# Patient Record
Sex: Male | Born: 1985 | Race: Black or African American | Hispanic: No | Marital: Married | Smoking: Former smoker
Health system: Southern US, Community
[De-identification: ages and names within clinical notes are randomized; demographics above are authoritative.]

## PROBLEM LIST (undated history)

## (undated) DIAGNOSIS — F32A Depression, unspecified: Secondary | ICD-10-CM

## (undated) DIAGNOSIS — F419 Anxiety disorder, unspecified: Secondary | ICD-10-CM

## (undated) DIAGNOSIS — J45909 Unspecified asthma, uncomplicated: Secondary | ICD-10-CM

## (undated) DIAGNOSIS — F319 Bipolar disorder, unspecified: Secondary | ICD-10-CM

## (undated) DIAGNOSIS — F4310 Post-traumatic stress disorder, unspecified: Secondary | ICD-10-CM

## (undated) DIAGNOSIS — D649 Anemia, unspecified: Secondary | ICD-10-CM

## (undated) HISTORY — PX: COLON SURGERY: SHX602

## (undated) HISTORY — PX: CARDIAC SURGERY: SHX584

---
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
## 2015-08-08 ENCOUNTER — Emergency Department (HOSPITAL_COMMUNITY)
Admission: EM | Admit: 2015-08-08 | Discharge: 2015-08-08 | Disposition: A | Discharge status: Home / Self Care | Payer: Self-pay | Attending: Emergency Medicine | Admitting: Emergency Medicine

## 2015-08-08 ENCOUNTER — Encounter (HOSPITAL_COMMUNITY): Payer: Self-pay | Admitting: *Deleted

## 2015-08-08 ENCOUNTER — Emergency Department (HOSPITAL_COMMUNITY): Payer: Self-pay

## 2015-08-08 DIAGNOSIS — W010XXA Fall on same level from slipping, tripping and stumbling without subsequent striking against object, initial encounter: Secondary | ICD-10-CM | POA: Insufficient documentation

## 2015-08-08 DIAGNOSIS — J45909 Unspecified asthma, uncomplicated: Secondary | ICD-10-CM | POA: Insufficient documentation

## 2015-08-08 DIAGNOSIS — Y829 Unspecified medical devices associated with adverse incidents: Secondary | ICD-10-CM | POA: Insufficient documentation

## 2015-08-08 DIAGNOSIS — T8131XA Disruption of external operation (surgical) wound, not elsewhere classified, initial encounter: Secondary | ICD-10-CM | POA: Insufficient documentation

## 2015-08-08 DIAGNOSIS — Y9389 Activity, other specified: Secondary | ICD-10-CM | POA: Insufficient documentation

## 2015-08-08 DIAGNOSIS — S51812A Laceration without foreign body of left forearm, initial encounter: Secondary | ICD-10-CM | POA: Insufficient documentation

## 2015-08-08 DIAGNOSIS — Y92149 Unspecified place in prison as the place of occurrence of the external cause: Secondary | ICD-10-CM | POA: Insufficient documentation

## 2015-08-08 DIAGNOSIS — S41112A Laceration without foreign body of left upper arm, initial encounter: Secondary | ICD-10-CM

## 2015-08-08 DIAGNOSIS — Y998 Other external cause status: Secondary | ICD-10-CM | POA: Insufficient documentation

## 2015-08-08 DIAGNOSIS — Z23 Encounter for immunization: Secondary | ICD-10-CM | POA: Insufficient documentation

## 2015-08-08 DIAGNOSIS — Z862 Personal history of diseases of the blood and blood-forming organs and certain disorders involving the immune mechanism: Secondary | ICD-10-CM | POA: Insufficient documentation

## 2015-08-08 HISTORY — DX: Anemia, unspecified: D64.9

## 2015-08-08 HISTORY — DX: Unspecified asthma, uncomplicated: J45.909

## 2015-08-08 MED ORDER — ACETAMINOPHEN 500 MG PO TABS
1000.0000 mg | ORAL_TABLET | Freq: Once | ORAL | Status: AC
  Administered 2015-08-08: 1000 mg via ORAL
  Filled 2015-08-08: qty 2

## 2015-08-08 MED ORDER — CEPHALEXIN 500 MG PO CAPS: 500.0000 mg | ORAL_CAPSULE | Freq: Four times a day (QID) | ORAL | Status: DC

## 2015-08-08 MED ORDER — BUPIVACAINE-EPINEPHRINE (PF) 0.5% -1:200000 IJ SOLN
10.0000 mL | Freq: Once | INTRAMUSCULAR | Status: AC
  Administered 2015-08-08: 10 mL
  Filled 2015-08-08: qty 10

## 2015-08-08 MED ORDER — BACITRACIN ZINC 500 UNIT/GM EX OINT
TOPICAL_OINTMENT | Freq: Two times a day (BID) | CUTANEOUS | Status: DC
  Administered 2015-08-08: 1 via TOPICAL

## 2015-08-08 MED ORDER — TETANUS-DIPHTH-ACELL PERTUSSIS 5-2.5-18.5 LF-MCG/0.5 IM SUSP
0.5000 mL | Freq: Once | INTRAMUSCULAR | Status: AC
  Administered 2015-08-08: 0.5 mL via INTRAMUSCULAR
  Filled 2015-08-08: qty 0.5

## 2015-08-08 NOTE — Discharge Instructions (Signed)
You have been seen today for a laceration that reopened. Your imaging showed no abnormalities. Go to the clinic or return to the ED in 3 days for a wound recheck. Go to the clinic or return to the ED in 10 days for suture removal. Return sooner should signs of infection or dehiscence occur. Dehiscence is when the wound begins to come apart. Please take all of your antibiotics until finished!   You may develop abdominal discomfort or diarrhea from the antibiotic.  You may help offset this with probiotics which you can buy or get in yogurt. Do not eat or take the probiotics until 2 hours after your antibiotic.

## 2015-08-08 NOTE — ED Provider Notes (Signed)
With laceration at the volar aspect of distal forearm after he fell today laceration occurred at site of scar from laceration and identical place which occurred 5 months ago. On exam patient is alert no distress there is a linear laceration at the volar distal forearm with flexor tenderness exposed. Hand and wrist have full range of motion  Doug Sou, MD 08/08/15 2201
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
## 2018-06-13 HISTORY — PX: OTHER SURGICAL HISTORY: SHX169

## 2018-11-28 ENCOUNTER — Encounter (HOSPITAL_COMMUNITY): Payer: Self-pay | Admitting: Emergency Medicine

## 2018-11-28 ENCOUNTER — Other Ambulatory Visit: Payer: Self-pay

## 2018-11-28 ENCOUNTER — Emergency Department (HOSPITAL_COMMUNITY): Payer: Medicaid Other

## 2018-11-28 ENCOUNTER — Emergency Department (HOSPITAL_COMMUNITY)
Admission: EM | Admit: 2018-11-28 | Discharge: 2018-11-28 | Disposition: A | Discharge status: Home / Self Care | Payer: Medicaid Other | Attending: Emergency Medicine | Admitting: Emergency Medicine

## 2018-11-28 DIAGNOSIS — J45909 Unspecified asthma, uncomplicated: Secondary | ICD-10-CM | POA: Insufficient documentation

## 2018-11-28 DIAGNOSIS — R079 Chest pain, unspecified: Secondary | ICD-10-CM | POA: Diagnosis present

## 2018-11-28 DIAGNOSIS — R0789 Other chest pain: Secondary | ICD-10-CM | POA: Diagnosis not present

## 2018-11-28 LAB — COMPREHENSIVE METABOLIC PANEL
ALT: 33 U/L (ref 0–44)
AST: 21 U/L (ref 15–41)
Albumin: 3.2 g/dL — ABNORMAL LOW (ref 3.5–5.0)
Alkaline Phosphatase: 78 U/L (ref 38–126)
Anion gap: 9 (ref 5–15)
BUN: <5 mg/dL — ABNORMAL LOW (ref 6–20)
CO2: 24 mmol/L (ref 22–32)
Calcium: 9.3 mg/dL (ref 8.9–10.3)
Chloride: 106 mmol/L (ref 98–111)
Creatinine, Ser: 0.86 mg/dL (ref 0.61–1.24)
GFR calc Af Amer: >60 mL/min (ref >60)
GFR calc non Af Amer: >60 mL/min (ref >60)
Glucose, Bld: 85 mg/dL (ref 70–99)
Potassium: 3.9 mmol/L (ref 3.5–5.1)
Sodium: 139 mmol/L (ref 135–145)
Total Bilirubin: 0.4 mg/dL (ref 0.3–1.2)
Total Protein: 6.2 g/dL — ABNORMAL LOW (ref 6.5–8.1)

## 2018-11-28 LAB — CBC
HCT: 35.2 % — ABNORMAL LOW (ref 39.0–52.0)
Hemoglobin: 10.5 g/dL — ABNORMAL LOW (ref 13.0–17.0)
MCH: 21.3 pg — ABNORMAL LOW (ref 26.0–34.0)
MCHC: 29.8 g/dL — ABNORMAL LOW (ref 30.0–36.0)
MCV: 71.3 fL — ABNORMAL LOW (ref 80.0–100.0)
Platelets: 256 10*3/uL (ref 150–400)
RBC: 4.94 MIL/uL (ref 4.22–5.81)
RDW: 19.1 % — ABNORMAL HIGH (ref 11.5–15.5)
WBC: 6.3 10*3/uL (ref 4.0–10.5)
nRBC: 0 % (ref 0.0–0.2)

## 2018-11-28 LAB — TROPONIN I: Troponin I: <0.03 ng/mL (ref <0.03)

## 2018-11-28 MED ORDER — KETOROLAC TROMETHAMINE 30 MG/ML IJ SOLN
30.0000 mg | Freq: Once | INTRAMUSCULAR | Status: AC
Start: 2018-11-28 — End: 2018-11-28
  Administered 2018-11-28: 06:00:00 30 mg via INTRAVENOUS
  Filled 2018-11-28: qty 1

## 2018-11-28 MED ORDER — ALBUTEROL SULFATE HFA 108 (90 BASE) MCG/ACT IN AERS
2.0000 | INHALATION_SPRAY | Freq: Once | RESPIRATORY_TRACT | Status: AC
  Administered 2018-11-28: 2 via RESPIRATORY_TRACT
  Filled 2018-11-28: qty 6.7

## 2018-11-28 NOTE — ED Notes (Signed)
Patient transported to x-ray. ?
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
## 2018-11-28 NOTE — Discharge Instructions (Addendum)
You may alternate Tylenol 1000 mg every 6 hours as needed for pain and Ibuprofen 800 mg every 8 hours as needed for pain.  Please take Ibuprofen with food.  Take albuterol every 4 hrs for cough   Return to ER if you have worse cough, shortness of breath, fever
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
## 2019-04-20 ENCOUNTER — Encounter (HOSPITAL_BASED_OUTPATIENT_CLINIC_OR_DEPARTMENT_OTHER): Payer: Self-pay

## 2019-04-20 ENCOUNTER — Other Ambulatory Visit: Payer: Self-pay

## 2019-04-20 ENCOUNTER — Emergency Department (HOSPITAL_BASED_OUTPATIENT_CLINIC_OR_DEPARTMENT_OTHER): Payer: Medicaid Other

## 2019-04-20 ENCOUNTER — Emergency Department (HOSPITAL_BASED_OUTPATIENT_CLINIC_OR_DEPARTMENT_OTHER)
Admission: EM | Admit: 2019-04-20 | Discharge: 2019-04-20 | Disposition: A | Discharge status: Home / Self Care | Payer: Medicaid Other | Attending: Emergency Medicine | Admitting: Emergency Medicine

## 2019-04-20 DIAGNOSIS — Z91012 Allergy to eggs: Secondary | ICD-10-CM | POA: Insufficient documentation

## 2019-04-20 DIAGNOSIS — Z20828 Contact with and (suspected) exposure to other viral communicable diseases: Secondary | ICD-10-CM | POA: Insufficient documentation

## 2019-04-20 DIAGNOSIS — Z91013 Allergy to seafood: Secondary | ICD-10-CM | POA: Insufficient documentation

## 2019-04-20 DIAGNOSIS — Z91010 Allergy to peanuts: Secondary | ICD-10-CM | POA: Insufficient documentation

## 2019-04-20 DIAGNOSIS — J069 Acute upper respiratory infection, unspecified: Secondary | ICD-10-CM | POA: Diagnosis not present

## 2019-04-20 DIAGNOSIS — F17290 Nicotine dependence, other tobacco product, uncomplicated: Secondary | ICD-10-CM | POA: Insufficient documentation

## 2019-04-20 DIAGNOSIS — R05 Cough: Secondary | ICD-10-CM | POA: Diagnosis present

## 2019-04-20 MED ORDER — BENZONATATE 100 MG PO CAPS: 100.0000 mg | ORAL_CAPSULE | Freq: Three times a day (TID) | ORAL | 0 refills | Status: DC

## 2019-04-20 MED ORDER — DEXAMETHASONE 6 MG PO TABS
12.0000 mg | ORAL_TABLET | Freq: Once | ORAL | Status: AC
  Administered 2019-04-20: 12:00:00 12 mg via ORAL

## 2019-04-20 MED ORDER — DEXAMETHASONE 4 MG PO TABS
ORAL_TABLET | ORAL | Status: AC
  Administered 2019-04-20: 12 mg via ORAL
  Filled 2019-04-20: qty 3

## 2019-04-20 NOTE — ED Triage Notes (Signed)
Pt states cough congestion for 2 days, states possible exposure to covid.  Vomited yesterday.

## 2019-04-21 LAB — SARS CORONAVIRUS 2 (TAT 6-24 HRS): SARS Coronavirus 2: NEGATIVE
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
## 2019-10-07 ENCOUNTER — Emergency Department (HOSPITAL_COMMUNITY)
Admission: EM | Admit: 2019-10-07 | Discharge: 2019-10-07 | Discharge status: Against Medical Advice | Payer: Medicaid Other | Attending: Emergency Medicine | Admitting: Emergency Medicine

## 2019-10-07 ENCOUNTER — Other Ambulatory Visit: Payer: Self-pay

## 2019-10-07 DIAGNOSIS — Z5321 Procedure and treatment not carried out due to patient leaving prior to being seen by health care provider: Secondary | ICD-10-CM | POA: Insufficient documentation

## 2019-10-07 DIAGNOSIS — R1110 Vomiting, unspecified: Secondary | ICD-10-CM | POA: Insufficient documentation

## 2019-10-07 NOTE — ED Triage Notes (Signed)
Pt c/o N/V/D since yesterday. Denies being around anyone sick.

## 2019-10-07 NOTE — ED Notes (Signed)
Pt decided to leave due to wait. States that he just wants something for the nausea and his blood work and to go home.Advised pt that this is not how that works. Pt states he will just leave.

## 2020-03-07 ENCOUNTER — Encounter (HOSPITAL_COMMUNITY): Payer: Self-pay | Admitting: Emergency Medicine

## 2020-03-07 ENCOUNTER — Emergency Department (HOSPITAL_COMMUNITY)
Admission: EM | Admit: 2020-03-07 | Discharge: 2020-03-07 | Disposition: A | Discharge status: Home / Self Care | Payer: Medicaid Other | Attending: Emergency Medicine | Admitting: Emergency Medicine

## 2020-03-07 DIAGNOSIS — Z91010 Allergy to peanuts: Secondary | ICD-10-CM | POA: Insufficient documentation

## 2020-03-07 DIAGNOSIS — J45909 Unspecified asthma, uncomplicated: Secondary | ICD-10-CM | POA: Insufficient documentation

## 2020-03-07 DIAGNOSIS — S61411A Laceration without foreign body of right hand, initial encounter: Secondary | ICD-10-CM

## 2020-03-07 DIAGNOSIS — S6991XA Unspecified injury of right wrist, hand and finger(s), initial encounter: Secondary | ICD-10-CM | POA: Diagnosis present

## 2020-03-07 DIAGNOSIS — W260XXA Contact with knife, initial encounter: Secondary | ICD-10-CM | POA: Insufficient documentation

## 2020-03-07 DIAGNOSIS — Y93G1 Activity, food preparation and clean up: Secondary | ICD-10-CM | POA: Diagnosis not present

## 2020-03-07 DIAGNOSIS — F17290 Nicotine dependence, other tobacco product, uncomplicated: Secondary | ICD-10-CM | POA: Insufficient documentation

## 2020-03-07 MED ORDER — CEPHALEXIN 500 MG PO CAPS: 500.0000 mg | ORAL_CAPSULE | Freq: Four times a day (QID) | ORAL | 0 refills | Status: AC

## 2020-03-07 NOTE — ED Notes (Signed)
Patient verbalizes understanding of discharge instructions. Opportunity for questioning and answers were provided. Pt discharged from ED. 
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
## 2020-03-07 NOTE — Discharge Instructions (Signed)
Take antibiotics as prescribed.  Take the entire course, even if symptoms improve. Soak your hand in soapy water for 20 minutes at a time 3 times a day for the next week. Try and keep your hand covered, do not pick at it or spread the cut open, as this will slow healing. Follow-up with a hand doctor listed below if your symptoms not improving after being on antibiotics for more than 48 hours. Return to the emergency room if you develop high fevers, redness streaking up past the wrist, numbness of your fingers, or any new or worsening, concerning symptoms.
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
## 2020-04-15 ENCOUNTER — Other Ambulatory Visit: Payer: Self-pay

## 2020-04-15 ENCOUNTER — Emergency Department (HOSPITAL_COMMUNITY): Payer: Medicaid Other

## 2020-04-15 ENCOUNTER — Emergency Department (HOSPITAL_COMMUNITY)
Admission: EM | Admit: 2020-04-15 | Discharge: 2020-04-15 | Disposition: A | Discharge status: Home / Self Care | Payer: Medicaid Other | Attending: Emergency Medicine | Admitting: Emergency Medicine

## 2020-04-15 ENCOUNTER — Encounter (HOSPITAL_COMMUNITY): Payer: Self-pay | Admitting: Emergency Medicine

## 2020-04-15 DIAGNOSIS — J45909 Unspecified asthma, uncomplicated: Secondary | ICD-10-CM | POA: Insufficient documentation

## 2020-04-15 DIAGNOSIS — S61411A Laceration without foreign body of right hand, initial encounter: Secondary | ICD-10-CM | POA: Diagnosis present

## 2020-04-15 DIAGNOSIS — Y30XXXA Falling, jumping or pushed from a high place, undetermined intent, initial encounter: Secondary | ICD-10-CM | POA: Diagnosis not present

## 2020-04-15 DIAGNOSIS — Y9289 Other specified places as the place of occurrence of the external cause: Secondary | ICD-10-CM | POA: Diagnosis not present

## 2020-04-15 DIAGNOSIS — F17290 Nicotine dependence, other tobacco product, uncomplicated: Secondary | ICD-10-CM | POA: Insufficient documentation

## 2020-04-15 DIAGNOSIS — Z23 Encounter for immunization: Secondary | ICD-10-CM | POA: Insufficient documentation

## 2020-04-15 DIAGNOSIS — S61412A Laceration without foreign body of left hand, initial encounter: Secondary | ICD-10-CM | POA: Insufficient documentation

## 2020-04-15 DIAGNOSIS — Y9339 Activity, other involving climbing, rappelling and jumping off: Secondary | ICD-10-CM | POA: Insufficient documentation

## 2020-04-15 DIAGNOSIS — Z91010 Allergy to peanuts: Secondary | ICD-10-CM | POA: Insufficient documentation

## 2020-04-15 MED ORDER — CEPHALEXIN 250 MG PO CAPS
500.0000 mg | ORAL_CAPSULE | Freq: Once | ORAL | Status: AC
  Administered 2020-04-15: 500 mg via ORAL
  Filled 2020-04-15: qty 2

## 2020-04-15 MED ORDER — TETANUS-DIPHTH-ACELL PERTUSSIS 5-2.5-18.5 LF-MCG/0.5 IM SUSY
0.5000 mL | PREFILLED_SYRINGE | Freq: Once | INTRAMUSCULAR | Status: AC
  Administered 2020-04-15: 0.5 mL via INTRAMUSCULAR
  Filled 2020-04-15: qty 0.5

## 2020-04-15 MED ORDER — CEPHALEXIN 500 MG PO CAPS: 500.0000 mg | ORAL_CAPSULE | Freq: Three times a day (TID) | ORAL | 0 refills | Status: DC

## 2020-04-15 MED ORDER — OXYCODONE HCL 5 MG PO TABS
5.0000 mg | ORAL_TABLET | Freq: Four times a day (QID) | ORAL | 0 refills | Status: DC | PRN
Start: 2020-04-15 — End: 2021-03-17

## 2020-04-15 MED ORDER — LIDOCAINE-EPINEPHRINE 1 %-1:100000 IJ SOLN
10.0000 mL | Freq: Once | INTRAMUSCULAR | Status: DC
  Filled 2020-04-15: qty 1

## 2020-04-15 NOTE — Discharge Instructions (Signed)
Please read and follow all provided instructions.  Your diagnoses today include:  1. Laceration of left hand without foreign body, initial encounter     Tests performed today include:  X-ray of the affected area that did not show any foreign bodies or broken bones  Vital signs. See below for your results today.   Medications prescribed:   Keflex (cephalexin) - antibiotic  You have been prescribed an antibiotic medicine: take the entire course of medicine even if you are feeling better. Stopping early can cause the antibiotic not to work.   Oxycodone - narcotic pain medication  DO NOT drive or perform any activities that require you to be awake and alert because this medicine can make you drowsy.   Take any prescribed medications only as directed.   Home care instructions:  Follow any educational materials and wound care instructions contained in this packet.   Keep affected area above the level of your heart when possible to minimize swelling. Wash area gently twice a day with warm soapy water. Do not apply alcohol or hydrogen peroxide. Cover the area if it draining or weeping.   Follow-up instructions: Suture Removal: Go to the [facility], return to the Emergency Department or see your primary care care doctor in 14 days for a recheck of your wound and removal of your sutures or staples.    Return instructions:  Return to the Emergency Department if you have:  Fever  Worsening pain  Worsening swelling of the wound  Pus draining from the wound  Redness of the skin that moves away from the wound, especially if it streaks away from the affected area   Any other emergent concerns  Your vital signs today were: BP (!) 145/89 (BP Location: Left Arm)   Pulse 77   Temp 98 F (36.7 C) (Oral)   Resp 16   SpO2 100%  If your blood pressure (BP) was elevated above 135/85 this visit, please have this repeated by your doctor within one month. --------------

## 2020-04-15 NOTE — ED Triage Notes (Signed)
Patient was climbing a fence to get home this morning, he has sliced open his right palm below his pinky finger.  It is an L shaped cut, full thickness, ligaments and muscle able to be seen.
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
## 2020-08-21 ENCOUNTER — Other Ambulatory Visit: Payer: Self-pay

## 2020-08-21 ENCOUNTER — Emergency Department (HOSPITAL_COMMUNITY)
Admission: EM | Admit: 2020-08-21 | Discharge: 2020-08-21 | Disposition: A | Discharge status: Home / Self Care | Attending: Emergency Medicine | Admitting: Emergency Medicine

## 2020-08-21 ENCOUNTER — Encounter (HOSPITAL_COMMUNITY): Payer: Self-pay

## 2020-08-21 DIAGNOSIS — F17290 Nicotine dependence, other tobacco product, uncomplicated: Secondary | ICD-10-CM | POA: Diagnosis not present

## 2020-08-21 DIAGNOSIS — S61512A Laceration without foreign body of left wrist, initial encounter: Secondary | ICD-10-CM | POA: Insufficient documentation

## 2020-08-21 DIAGNOSIS — J45909 Unspecified asthma, uncomplicated: Secondary | ICD-10-CM | POA: Insufficient documentation

## 2020-08-21 DIAGNOSIS — T1491XA Suicide attempt, initial encounter: Secondary | ICD-10-CM

## 2020-08-21 DIAGNOSIS — R45851 Suicidal ideations: Secondary | ICD-10-CM | POA: Diagnosis not present

## 2020-08-21 DIAGNOSIS — Z91010 Allergy to peanuts: Secondary | ICD-10-CM | POA: Diagnosis not present

## 2020-08-21 DIAGNOSIS — X831XXA Intentional self-harm by electrocution, initial encounter: Secondary | ICD-10-CM | POA: Diagnosis not present

## 2020-08-21 DIAGNOSIS — S6992XA Unspecified injury of left wrist, hand and finger(s), initial encounter: Secondary | ICD-10-CM | POA: Diagnosis present
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
## 2020-08-21 NOTE — Discharge Instructions (Addendum)
Recommend suicide watch and psychiatric assessment/care.   He refuses any further medical treatment in the emergency department. Wound care instructions provided.

## 2020-08-21 NOTE — ED Notes (Signed)
Pt discharged from this facility at this time. Discharged in policy custody. All instructions reviewed with no further questions at this time.
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
## 2020-09-04 ENCOUNTER — Encounter (HOSPITAL_COMMUNITY): Payer: Self-pay

## 2020-09-04 ENCOUNTER — Emergency Department (HOSPITAL_COMMUNITY)
Admission: EM | Admit: 2020-09-04 | Discharge: 2020-09-04 | Disposition: A | Discharge status: Home / Self Care | Attending: Emergency Medicine | Admitting: Emergency Medicine

## 2020-09-04 DIAGNOSIS — Z23 Encounter for immunization: Secondary | ICD-10-CM | POA: Insufficient documentation

## 2020-09-04 DIAGNOSIS — X58XXXA Exposure to other specified factors, initial encounter: Secondary | ICD-10-CM | POA: Insufficient documentation

## 2020-09-04 DIAGNOSIS — S6992XA Unspecified injury of left wrist, hand and finger(s), initial encounter: Secondary | ICD-10-CM | POA: Diagnosis present

## 2020-09-04 DIAGNOSIS — F17200 Nicotine dependence, unspecified, uncomplicated: Secondary | ICD-10-CM | POA: Diagnosis not present

## 2020-09-04 DIAGNOSIS — J45909 Unspecified asthma, uncomplicated: Secondary | ICD-10-CM | POA: Insufficient documentation

## 2020-09-04 DIAGNOSIS — Z91010 Allergy to peanuts: Secondary | ICD-10-CM | POA: Diagnosis not present

## 2020-09-04 DIAGNOSIS — F4325 Adjustment disorder with mixed disturbance of emotions and conduct: Secondary | ICD-10-CM | POA: Insufficient documentation

## 2020-09-04 DIAGNOSIS — R45851 Suicidal ideations: Secondary | ICD-10-CM | POA: Insufficient documentation

## 2020-09-04 DIAGNOSIS — S61512A Laceration without foreign body of left wrist, initial encounter: Secondary | ICD-10-CM | POA: Insufficient documentation

## 2020-09-04 LAB — COMPREHENSIVE METABOLIC PANEL
ALT: 23 U/L (ref 0–44)
AST: 31 U/L (ref 15–41)
Albumin: 4.3 g/dL (ref 3.5–5.0)
Alkaline Phosphatase: 64 U/L (ref 38–126)
Anion gap: 9 (ref 5–15)
BUN: 12 mg/dL (ref 6–20)
CO2: 24 mmol/L (ref 22–32)
Calcium: 9.4 mg/dL (ref 8.9–10.3)
Chloride: 105 mmol/L (ref 98–111)
Creatinine, Ser: 1.03 mg/dL (ref 0.61–1.24)
GFR, Estimated: >60 mL/min (ref >60)
Glucose, Bld: 96 mg/dL (ref 70–99)
Potassium: 3.8 mmol/L (ref 3.5–5.1)
Sodium: 138 mmol/L (ref 135–145)
Total Bilirubin: 1.8 mg/dL — ABNORMAL HIGH (ref 0.3–1.2)
Total Protein: 7.6 g/dL (ref 6.5–8.1)

## 2020-09-04 LAB — CBC WITH DIFFERENTIAL/PLATELET
Abs Immature Granulocytes: 0.06 10*3/uL (ref 0.00–0.07)
Basophils Absolute: 0 10*3/uL (ref 0.0–0.1)
Basophils Relative: 0 %
Eosinophils Absolute: 0 10*3/uL (ref 0.0–0.5)
Eosinophils Relative: 0 %
HCT: 47.7 % (ref 39.0–52.0)
Hemoglobin: 15.2 g/dL (ref 13.0–17.0)
Immature Granulocytes: 1 %
Lymphocytes Relative: 11 %
Lymphs Abs: 1.2 10*3/uL (ref 0.7–4.0)
MCH: 24.2 pg — ABNORMAL LOW (ref 26.0–34.0)
MCHC: 31.9 g/dL (ref 30.0–36.0)
MCV: 76 fL — ABNORMAL LOW (ref 80.0–100.0)
Monocytes Absolute: 1 10*3/uL (ref 0.1–1.0)
Monocytes Relative: 10 %
Neutro Abs: 7.9 10*3/uL — ABNORMAL HIGH (ref 1.7–7.7)
Neutrophils Relative %: 78 %
Platelets: 272 10*3/uL (ref 150–400)
RBC: 6.28 MIL/uL — ABNORMAL HIGH (ref 4.22–5.81)
RDW: 16.6 % — ABNORMAL HIGH (ref 11.5–15.5)
WBC: 10.1 10*3/uL (ref 4.0–10.5)
nRBC: 0 % (ref 0.0–0.2)

## 2020-09-04 LAB — ETHANOL: Alcohol, Ethyl (B): <10 mg/dL (ref <10)

## 2020-09-04 MED ORDER — TETANUS-DIPHTH-ACELL PERTUSSIS 5-2.5-18.5 LF-MCG/0.5 IM SUSY
0.5000 mL | PREFILLED_SYRINGE | Freq: Once | INTRAMUSCULAR | Status: AC
  Administered 2020-09-04: 0.5 mL via INTRAMUSCULAR
  Filled 2020-09-04: qty 0.5

## 2020-09-04 NOTE — ED Triage Notes (Signed)
Pt [facility] the ED from jail via ems for a left forearm laceration. Pt has a hx of self-harm and extensive psychiatric hx. Per EMS, pt lacerated left wrist with plastic spoon.

## 2020-09-04 NOTE — ED Notes (Signed)
TTS at the bedside. 

## 2020-09-04 NOTE — ED Notes (Addendum)
Patient had removed previous bandage from wound. Refusing to let staff re-wrap his arm. Explained that it needs to be wrapped to keep it clean, patient still refusing. Alert and oriented x4. Discharged back to jail, with [facility].
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
## 2020-09-04 NOTE — BH Assessment (Signed)
[facility] Assessment Progress Note  Per Nanine Means, NP, this voluntary pt does not require psychiatric hospitalization at this time.  Pt is psychiatrically cleared.  Discharge instructions include referral information for [facility] where pt has received services in the past, as well as [facility].  EDP Bethann Berkshire, MD and pt's nurses, Ladona Ridgel and [place], have been notified.  Doylene Canning, MA Triage Specialist [phone]
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
## 2020-09-04 NOTE — ED Notes (Signed)
Per ED tech, pt removed bandage from left forearm lac. ED tech to redress pt's wound.

## 2020-09-04 NOTE — ED Notes (Signed)
Pt bandage on left forearm redressed after pt took off initial bandage. Pt educated on chance of infection should bandage remain off. Provider notified

## 2020-09-04 NOTE — Discharge Instructions (Addendum)
For your mental health needs, you are advised to continue treatment with [facility]:       [facility]      [address], [address]      [place], [place] [address]      [phone]  If for any reason you are unable to follow up with [facility], contact [facility]:       [facility]      [address]      [place], [place] [address]      [phone]  Patient must remain on suicide watch at the jail.  He needs to keep his laceration covered and clean it twice a day with soap and water.  We are going to refer him to a specialist to get that rechecked next week

## 2020-09-04 NOTE — ED Notes (Signed)
Pt's left forearm lac cleaned and dressed by ED tech.

## 2020-09-08 ENCOUNTER — Other Ambulatory Visit: Payer: Self-pay

## 2020-09-08 ENCOUNTER — Emergency Department (HOSPITAL_COMMUNITY)
Admission: EM | Admit: 2020-09-08 | Discharge: 2020-09-08 | Disposition: A | Discharge status: Home / Self Care | Attending: Emergency Medicine | Admitting: Emergency Medicine

## 2020-09-08 ENCOUNTER — Emergency Department (HOSPITAL_COMMUNITY)

## 2020-09-08 ENCOUNTER — Encounter (HOSPITAL_COMMUNITY): Payer: Self-pay

## 2020-09-08 DIAGNOSIS — F17290 Nicotine dependence, other tobacco product, uncomplicated: Secondary | ICD-10-CM | POA: Insufficient documentation

## 2020-09-08 DIAGNOSIS — Y92149 Unspecified place in prison as the place of occurrence of the external cause: Secondary | ICD-10-CM | POA: Diagnosis not present

## 2020-09-08 DIAGNOSIS — M795 Residual foreign body in soft tissue: Secondary | ICD-10-CM

## 2020-09-08 DIAGNOSIS — J45909 Unspecified asthma, uncomplicated: Secondary | ICD-10-CM | POA: Diagnosis not present

## 2020-09-08 DIAGNOSIS — X788XXA Intentional self-harm by other sharp object, initial encounter: Secondary | ICD-10-CM | POA: Diagnosis not present

## 2020-09-08 DIAGNOSIS — S50852A Superficial foreign body of left forearm, initial encounter: Secondary | ICD-10-CM | POA: Diagnosis not present

## 2020-09-08 DIAGNOSIS — Z91010 Allergy to peanuts: Secondary | ICD-10-CM | POA: Diagnosis not present

## 2020-09-08 DIAGNOSIS — S59912A Unspecified injury of left forearm, initial encounter: Secondary | ICD-10-CM | POA: Diagnosis present

## 2020-09-08 MED ORDER — CEPHALEXIN 500 MG PO CAPS: 500.0000 mg | ORAL_CAPSULE | Freq: Four times a day (QID) | ORAL | 0 refills | Status: DC

## 2020-09-08 MED ORDER — LIDOCAINE HCL (PF) 1 % IJ SOLN
30.0000 mL | Freq: Once | INTRAMUSCULAR | Status: AC
  Administered 2020-09-08: 30 mL
  Filled 2020-09-08: qty 30

## 2020-09-08 MED ORDER — BACITRACIN ZINC 500 UNIT/GM EX OINT
TOPICAL_OINTMENT | Freq: Two times a day (BID) | CUTANEOUS | Status: DC
  Filled 2020-09-08: qty 0.9

## 2020-09-08 NOTE — ED Triage Notes (Signed)
Pt to ED from jail for evaluation of left arm. Pt states he cut his arm and stuck a paperclip up into the incision. Pt reports it as  "self injurious" . Arrives A+O, VSS, NADN.
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
## 2020-09-08 NOTE — ED Notes (Signed)
Left forearm wound cleaned and dressed. Pt educated on how to perform wound care.

## 2020-09-08 NOTE — Discharge Instructions (Addendum)
You will need to follow-up with wound care at the person.  You will need to maintain the splint clean and dry along with apply bacitracin ointment on it.  I have prescribed the antibiotics to help treat infection, please take 1 tablet 4 times a day for the next 7 days.  The number to Dr. Carollee Massed office is attached to your chart if your wound does not heal or you need further evaluation you will need to schedule an appointment with him.

## 2020-09-11 ENCOUNTER — Other Ambulatory Visit: Payer: Self-pay

## 2020-09-11 ENCOUNTER — Emergency Department (HOSPITAL_COMMUNITY)

## 2020-09-11 ENCOUNTER — Encounter (HOSPITAL_COMMUNITY): Payer: Self-pay | Admitting: Emergency Medicine

## 2020-09-11 ENCOUNTER — Emergency Department (HOSPITAL_COMMUNITY)
Admission: EM | Admit: 2020-09-11 | Discharge: 2020-09-12 | Disposition: A | Discharge status: Home / Self Care | Attending: Emergency Medicine | Admitting: Emergency Medicine

## 2020-09-11 DIAGNOSIS — S41112A Laceration without foreign body of left upper arm, initial encounter: Secondary | ICD-10-CM

## 2020-09-11 DIAGNOSIS — S51822A Laceration with foreign body of left forearm, initial encounter: Secondary | ICD-10-CM | POA: Diagnosis not present

## 2020-09-11 DIAGNOSIS — F17290 Nicotine dependence, other tobacco product, uncomplicated: Secondary | ICD-10-CM | POA: Diagnosis not present

## 2020-09-11 DIAGNOSIS — Z91010 Allergy to peanuts: Secondary | ICD-10-CM | POA: Diagnosis not present

## 2020-09-11 DIAGNOSIS — J45909 Unspecified asthma, uncomplicated: Secondary | ICD-10-CM | POA: Diagnosis not present

## 2020-09-11 DIAGNOSIS — X789XXA Intentional self-harm by unspecified sharp object, initial encounter: Secondary | ICD-10-CM | POA: Insufficient documentation

## 2020-09-11 DIAGNOSIS — Y92149 Unspecified place in prison as the place of occurrence of the external cause: Secondary | ICD-10-CM | POA: Insufficient documentation

## 2020-09-11 DIAGNOSIS — S59912A Unspecified injury of left forearm, initial encounter: Secondary | ICD-10-CM | POA: Diagnosis present

## 2020-09-11 DIAGNOSIS — S40852A Superficial foreign body of left upper arm, initial encounter: Secondary | ICD-10-CM

## 2020-09-11 MED ORDER — CEPHALEXIN 500 MG PO CAPS: 500.0000 mg | ORAL_CAPSULE | Freq: Four times a day (QID) | ORAL | 0 refills | Status: AC

## 2020-09-11 MED ORDER — BUPIVACAINE HCL (PF) 0.5 % IJ SOLN
20.0000 mL | Freq: Once | INTRAMUSCULAR | Status: AC
  Administered 2020-09-11: 20 mL
  Filled 2020-09-11 (×2): qty 30

## 2020-09-11 MED ORDER — LIDOCAINE HCL (PF) 1 % IJ SOLN
10.0000 mL | Freq: Once | INTRAMUSCULAR | Status: AC
  Administered 2020-09-11: 10 mL
  Filled 2020-09-11: qty 30

## 2020-09-11 NOTE — ED Triage Notes (Signed)
Pt arrives from jail with self inflicted wound. Pt cut L arm with paperclip. Pieces of paperclip are still in arm. Pt reports he does feel like he wants to kill himself.

## 2020-09-11 NOTE — ED Notes (Signed)
Cleansed/irrigated wounds with ns.  Pt tolerated well.  Foreign body noted in left lower forearm arm wound (pt states paper clips were used to harm self).

## 2020-09-11 NOTE — Discharge Instructions (Addendum)
Elevate the left arm above your heart is much as possible.  Take Tylenol for pain.  Call the hand surgeon for a follow-up appointment on Monday.  He will evaluate the foreign body that remains in your left forearm and determine the best course of action to treat that.  You can leave the bandage on the left arm, until you see the surgeon and unless it is causing a lot of pain, bleeding or swelling.  Make sure it gets checked out if you have these problems.
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
## 2020-09-11 NOTE — ED Provider Notes (Signed)
  Face-to-face evaluation   History:   Physical exam:   .Foreign Body Removal  Date/Time: 09/11/2020 11:05 PM Performed by: Mancel Bale, MD Authorized by: Mancel Bale, MD  Consent: Verbal consent obtained. Patient identity confirmed: verbally with patient Intake: Left volar forearm. Anesthesia: local infiltration  Anesthesia: Local Anesthetic: bupivacaine 0.5% without epinephrine Anesthetic total: 15 mL  Sedation: Patient sedated: no  Patient restrained: no Complexity: complex 1 objects recovered. Objects recovered: A piece of paper clip Post-procedure assessment: foreign body not removed Patient tolerance: patient tolerated the procedure well with no immediate complications Comments: Patient had 2 foreign bodies in the wound of his left volar forearm.  The more distal foreign body, seen on second radiograph, was removed after untwisted and removed from the tendon material.  The other more proximal foreign body, essentially linear, could not be palpated, after probing about 3 cm more proximal in the extent of the wound.  The foreign body is likely deep in the muscle bundle, beyond what I can explore in the emergency department setting.  The wound was closed with suture material, loosely, to improve healing, and comfort.  3 loose simple sutures, were placed using 3-0 Prolene.  Xeroform dressing placed.    Medical screening examination/treatment/procedure(s) were conducted as a shared visit with non-physician practitioner(s) and myself.  I personally evaluated the patient during the encounter    Mancel Bale, MD 09/14/20 1005

## 2020-09-12 NOTE — ED Notes (Signed)
Cleaned lower forearm wound with NS, applied xeroform gauze, covered with sterile 4x4 dry gauze, wrapped with kerlix then secured with coban.

## 2020-09-13 ENCOUNTER — Encounter (HOSPITAL_COMMUNITY): Payer: Self-pay | Admitting: *Deleted

## 2020-09-13 ENCOUNTER — Emergency Department (HOSPITAL_COMMUNITY)
Admission: EM | Admit: 2020-09-13 | Discharge: 2020-09-13 | Disposition: A | Discharge status: Home / Self Care | Attending: Emergency Medicine | Admitting: Emergency Medicine

## 2020-09-13 ENCOUNTER — Other Ambulatory Visit: Payer: Self-pay

## 2020-09-13 ENCOUNTER — Encounter (HOSPITAL_COMMUNITY): Payer: Self-pay | Admitting: Emergency Medicine

## 2020-09-13 DIAGNOSIS — S31119A Laceration without foreign body of abdominal wall, unspecified quadrant without penetration into peritoneal cavity, initial encounter: Secondary | ICD-10-CM

## 2020-09-13 DIAGNOSIS — S41112A Laceration without foreign body of left upper arm, initial encounter: Secondary | ICD-10-CM | POA: Diagnosis not present

## 2020-09-13 DIAGNOSIS — S3991XA Unspecified injury of abdomen, initial encounter: Secondary | ICD-10-CM | POA: Diagnosis present

## 2020-09-13 DIAGNOSIS — W268XXA Contact with other sharp object(s), not elsewhere classified, initial encounter: Secondary | ICD-10-CM | POA: Diagnosis not present

## 2020-09-13 DIAGNOSIS — F17200 Nicotine dependence, unspecified, uncomplicated: Secondary | ICD-10-CM | POA: Diagnosis not present

## 2020-09-13 DIAGNOSIS — S4992XA Unspecified injury of left shoulder and upper arm, initial encounter: Secondary | ICD-10-CM | POA: Diagnosis present

## 2020-09-13 DIAGNOSIS — F17290 Nicotine dependence, other tobacco product, uncomplicated: Secondary | ICD-10-CM | POA: Insufficient documentation

## 2020-09-13 DIAGNOSIS — X788XXA Intentional self-harm by other sharp object, initial encounter: Secondary | ICD-10-CM | POA: Diagnosis not present

## 2020-09-13 DIAGNOSIS — J45909 Unspecified asthma, uncomplicated: Secondary | ICD-10-CM | POA: Insufficient documentation

## 2020-09-13 DIAGNOSIS — Z91010 Allergy to peanuts: Secondary | ICD-10-CM | POA: Insufficient documentation

## 2020-09-13 HISTORY — DX: Anxiety disorder, unspecified: F41.9

## 2020-09-13 HISTORY — DX: Anemia, unspecified: D64.9

## 2020-09-13 HISTORY — DX: Unspecified asthma, uncomplicated: J45.909

## 2020-09-13 HISTORY — DX: Post-traumatic stress disorder, unspecified: F43.10

## 2020-09-13 HISTORY — DX: Depression, unspecified: F32.A

## 2020-09-13 HISTORY — DX: Bipolar disorder, unspecified: F31.9

## 2020-09-13 MED ORDER — LIDOCAINE-EPINEPHRINE (PF) 2 %-1:200000 IJ SOLN: 20.0000 mL | Freq: Once | INTRAMUSCULAR | Status: AC

## 2020-09-13 MED ORDER — LIDOCAINE-EPINEPHRINE 1 %-1:100000 IJ SOLN
10.0000 mL | Freq: Once | INTRAMUSCULAR | Status: AC
  Administered 2020-09-13: 10 mL
  Filled 2020-09-13: qty 1

## 2020-09-13 MED ORDER — LIDOCAINE-EPINEPHRINE (PF) 2 %-1:200000 IJ SOLN
INTRAMUSCULAR | Status: AC
  Administered 2020-09-13: 20 mL
  Filled 2020-09-13: qty 20

## 2020-09-13 NOTE — ED Triage Notes (Signed)
Patient presents with a self-inflicted cut to his arm caused by a paperclip. Has been seen for same before.

## 2020-09-13 NOTE — ED Provider Notes (Signed)
[facility] EMERGENCY DEPARTMENT Provider Note   CSN: [id number] Arrival date & time: 09/13/20  1336     History No chief complaint on file.   Zachary Hood is a 35 y.o. male.  Patient is a 35 year old male with a history of depression, bipolar disease, recurrent self-inflicted injuries when in jail who is presenting today from jail as a level 1 trauma after a self-inflicted wound to the abdomen.  Patient reviewed reports he used a razor blade to cut his abdomen.  He has been seen numerous times in the last 1 week since he has been incarcerated.  He has injury with old laceration to the left ventral portion of the distal forearm, laceration to the left distal humerus which he was seen for in the middle of the night because he continued to open the wound with a paperclip and remove the sutures.  Overnight he refused to have them repair it.  Patient has been evaluated by psychiatry for this in the last 1 week and felt to not be a candidate for inpatient psychiatric care as they felt that this was manipulative behavior in an effort to leave jail.  He has been on suicide watch in the jail.  Patient complains of pain in his abdomen but has no other complaints at this time.  Tetanus shot is up-to-date.  The history is provided by the patient.       Past Medical History:  Diagnosis Date  . Anemia   . Anxiety   . Asthma   . Bipolar 1 disorder (HCC)   . Depression   . PTSD (post-traumatic stress disorder)     There are no problems to display for this patient.        History reviewed. No pertinent family history.  Social History   Tobacco Use  . Smoking status: Current Some Day Smoker  . Smokeless tobacco: Never Used  Substance Use Topics  . Alcohol use: Yes  . Drug use: Not Currently    Home Medications Prior to Admission medications   Not on File    Allergies    Patient has no allergy information on record.  Review of Systems   Review of  Systems  All other systems reviewed and are negative.   Physical Exam Updated Vital Signs BP 123/82   Pulse 75   Temp 99.2 F (37.3 C) (Oral)   Resp 16   SpO2 100%   Physical Exam Vitals and nursing note reviewed.  Constitutional:      General: He is not in acute distress.    Appearance: Normal appearance. He is well-developed and normal weight.  HENT:     Head: Normocephalic and atraumatic.  Eyes:     Conjunctiva/sclera: Conjunctivae normal.     Pupils: Pupils are equal, round, and reactive to light.  Cardiovascular:     Rate and Rhythm: Normal rate and regular rhythm.     Heart sounds: No murmur heard.   Pulmonary:     Effort: Pulmonary effort is normal. No respiratory distress.     Breath sounds: Normal breath sounds. No wheezing or rales.  Abdominal:     General: Abdomen is flat. There is no distension.     Palpations: Abdomen is soft.     Tenderness: There is no abdominal tenderness. There is no guarding or rebound.    Musculoskeletal:        General: No tenderness. Normal range of motion.     Left upper arm: Laceration  present.       Arms:     Cervical back: Normal range of motion and neck supple.  Skin:    General: Skin is warm and dry.     Findings: No erythema or rash.  Neurological:     Mental Status: He is alert and oriented to person, place, and time. Mental status is at baseline.  Psychiatric:     Comments: Calm and cooperative     ED Results / Procedures / Treatments   Labs (all labs ordered are listed, but only abnormal results are displayed) Labs Reviewed - No data to display  EKG None  Radiology No results found.  Procedures Procedures  LACERATION REPAIR Performed by: [facility] Authorized by: Gwyneth Sprout Consent: Verbal consent obtained. Risks and benefits: risks, benefits and alternatives were discussed Consent given by: patient Patient identity confirmed: provided demographic data Prepped and Draped in normal  sterile fashion Wound explored  Laceration Location: abdomen  Laceration Length: 6cm  No Foreign Bodies seen or palpated  Anesthesia: local infiltration  Local anesthetic: lidocaine 2% with epinephrine  Anesthetic total: 10 ml  Irrigation method: syringe Amount of cleaning: standard  Skin closure: 4.0 vicryl and 3.0 prolene  Number of sutures: 4 vicryl and 10 prolene  Technique: buried and simple interrupted.  Patient tolerance: Patient tolerated the procedure well with no immediate complications. LACERATION REPAIR Performed by: [facility] Authorized by: Gwyneth Sprout Consent: Verbal consent obtained. Risks and benefits: risks, benefits and alternatives were discussed Consent given by: patient Patient identity confirmed: provided demographic data Prepped and Draped in normal sterile fashion Wound explored  Laceration Location: left distal humerus  Laceration Length: 4cm  No Foreign Bodies seen or palpated  Anesthesia: local infiltration  Local anesthetic: lidocaine 2 % with epinephrine  Anesthetic total: 5 ml  Irrigation method: syringe Amount of cleaning: standard  Skin closure: 3.0 prolene  Number of sutures: 4  Technique: simple interrupted  Patient tolerance: Patient tolerated the procedure well with no immediate complications.   Medications Ordered in ED Medications  lidocaine-EPINEPHrine (XYLOCAINE W/EPI) 1 %-1:100000 (with pres) injection 10 mL (10 mLs Infiltration Given 09/13/20 1425)    ED Course  I have reviewed the triage vital signs and the nursing notes.  Pertinent labs & imaging results that were available during my care of the patient were reviewed by me and considered in my medical decision making (see chart for details).    MDM Rules/Calculators/A&P                          Patient presenting today initially as a level 1 trauma due to a self-inflicted wound to the middle of his abdomen.  Patient reports he used a razor  blade to cut it.  On evaluation patient is hemodynamically stable.  It does not appear that the laceration violates the rectus sheath.  Trauma surgery is also at bedside.  Dr. Sheliah Hatch evaluated the wound at bedside and does not appreciate any rectus sheath violation.  Do not feel that patient needs a CT at this time.  His abdomen is soft and he has no pain except for where the laceration is.  Patient's tetanus shot is up-to-date.  Patient has now been seen 5 times in the last few days for ongoing issues with lacerations he is caused and then he removes the sutures and use paperclips to tear the wounds back open.  Patient was last seen at 1:20 AM this morning and  at that time he refused to let them fix the wound reporting that he would just rip it back open again.  When patient given the option whether he wants his wounds closed he reports of course he does and he would never remove the sutures.  Patient had the gaping wound on his distal humerus loosely closed and the wound on his abdomen closed.  Dressings were placed.  Patient will remain on suicide watch at the jail.  Patient has been evaluated by psychiatry for this and these were felt to be manipulative behaviors.  Patient reports he is not happy about being in jail and all of this started 1 week ago when he was placed in jail.  Prior to discharge the nurse called saying that patient may have injected something into his rectum.  When asking the patient about this he reports that he had picked it out in the jail and there is nothing in his rectum now.  He reports he does not want me to check his rectum and given the patient has no significant abdominal pain and reports there is nothing there now do not feel that it is warranted to do an exam the patient does not wish to have.  Final Clinical Impression(s) / ED Diagnoses Final diagnoses:  Laceration of abdominal wall, initial encounter    Rx / DC Orders ED Discharge Orders    None       Gwyneth Sprout, MD 09/13/20 1514

## 2020-09-13 NOTE — Progress Notes (Signed)
Evaluated patient for level 1 for self inflicted laceration to abdomen. On exam he has a 6-8 cm laceration through the skin with visible subcutaneous tissue. There is no evidence of reaching the fascia or any breach concerning for intraperitoneal injury. -no indications for imaging -repair skin laceration -no indication for trauma admission

## 2020-09-13 NOTE — Discharge Instructions (Signed)
You need to remain on suicide watch and keep the sutures in place.  Also keep the wounds covered and change the dressing daily
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
## 2020-09-14 ENCOUNTER — Encounter (HOSPITAL_COMMUNITY): Payer: Self-pay | Admitting: Emergency Medicine

## 2020-10-17 ENCOUNTER — Encounter (HOSPITAL_COMMUNITY): Payer: Self-pay | Admitting: Emergency Medicine

## 2020-10-17 ENCOUNTER — Emergency Department (HOSPITAL_COMMUNITY): Payer: Medicaid Other

## 2020-10-17 ENCOUNTER — Emergency Department (HOSPITAL_COMMUNITY)
Admission: EM | Admit: 2020-10-17 | Discharge: 2020-10-17 | Disposition: A | Discharge status: Home / Self Care | Payer: Medicaid Other | Attending: Emergency Medicine | Admitting: Emergency Medicine

## 2020-10-17 ENCOUNTER — Other Ambulatory Visit: Payer: Self-pay

## 2020-10-17 DIAGNOSIS — F17200 Nicotine dependence, unspecified, uncomplicated: Secondary | ICD-10-CM | POA: Diagnosis not present

## 2020-10-17 DIAGNOSIS — S51842D Puncture wound with foreign body of left forearm, subsequent encounter: Secondary | ICD-10-CM | POA: Insufficient documentation

## 2020-10-17 DIAGNOSIS — S59912D Unspecified injury of left forearm, subsequent encounter: Secondary | ICD-10-CM | POA: Diagnosis present

## 2020-10-17 DIAGNOSIS — J45909 Unspecified asthma, uncomplicated: Secondary | ICD-10-CM | POA: Diagnosis not present

## 2020-10-17 DIAGNOSIS — Z91010 Allergy to peanuts: Secondary | ICD-10-CM | POA: Insufficient documentation

## 2020-10-17 DIAGNOSIS — S40852D Superficial foreign body of left upper arm, subsequent encounter: Secondary | ICD-10-CM

## 2020-10-17 DIAGNOSIS — W458XXD Other foreign body or object entering through skin, subsequent encounter: Secondary | ICD-10-CM | POA: Diagnosis not present

## 2020-10-17 MED ORDER — ACETAMINOPHEN 500 MG PO TABS
500.0000 mg | ORAL_TABLET | Freq: Once | ORAL | Status: DC
  Filled 2020-10-17: qty 1

## 2020-10-17 NOTE — ED Notes (Signed)
Patient given discharge instructions. Questions were answered. Patient verbalized understanding of discharge instructions and care at home.  

## 2020-10-17 NOTE — Discharge Instructions (Addendum)
It is recommended that you follow-up with hand surgery as we discussed.  Please schedule an appointment with them to get this paperclip removed from your hand.  Both numbers are provided. If you have any signs of infection such as redness, fever or streaking from your wound then you need to come back to the ER.  If you have any new worsening concerning symptom please come back to the emergency department.  You can take Tylenol as directed on the bottle for pain.  Contact a health care provider if: You develop more pain or other new symptoms around the area where the object entered the skin. You have redness, swelling, or pain around your wound or incision. You have fluid or blood coming from your wound or incision. Your wound or incision feels warm to the touch. You have pus or a bad smell coming from your wound or incision. You have a fever.

## 2020-10-17 NOTE — ED Triage Notes (Signed)
Pt states he stuck a paper clip in his L arm 1 month ago while in prison.  States he was seen by a doctor at that time but they were unable to remove it.  Scar tissue noted to L anterior forearm.

## 2020-10-17 NOTE — ED Notes (Signed)
MSE Completed 
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
## 2021-01-25 ENCOUNTER — Other Ambulatory Visit: Payer: Self-pay

## 2021-01-25 ENCOUNTER — Emergency Department (HOSPITAL_COMMUNITY)
Admission: EM | Admit: 2021-01-25 | Discharge: 2021-01-25 | Disposition: A | Discharge status: Home / Self Care | Payer: Medicaid Other | Attending: Emergency Medicine | Admitting: Emergency Medicine

## 2021-01-25 ENCOUNTER — Emergency Department (HOSPITAL_COMMUNITY): Payer: Medicaid Other

## 2021-01-25 ENCOUNTER — Encounter (HOSPITAL_COMMUNITY): Payer: Self-pay

## 2021-01-25 DIAGNOSIS — Z202 Contact with and (suspected) exposure to infections with a predominantly sexual mode of transmission: Secondary | ICD-10-CM | POA: Diagnosis not present

## 2021-01-25 DIAGNOSIS — R0602 Shortness of breath: Secondary | ICD-10-CM

## 2021-01-25 DIAGNOSIS — F17200 Nicotine dependence, unspecified, uncomplicated: Secondary | ICD-10-CM | POA: Insufficient documentation

## 2021-01-25 DIAGNOSIS — Z91010 Allergy to peanuts: Secondary | ICD-10-CM | POA: Diagnosis not present

## 2021-01-25 DIAGNOSIS — R0781 Pleurodynia: Secondary | ICD-10-CM | POA: Diagnosis not present

## 2021-01-25 DIAGNOSIS — J45909 Unspecified asthma, uncomplicated: Secondary | ICD-10-CM | POA: Diagnosis not present

## 2021-01-25 DIAGNOSIS — Z79899 Other long term (current) drug therapy: Secondary | ICD-10-CM | POA: Diagnosis not present

## 2021-01-25 DIAGNOSIS — Z20822 Contact with and (suspected) exposure to covid-19: Secondary | ICD-10-CM | POA: Diagnosis not present

## 2021-01-25 LAB — URINALYSIS, ROUTINE W REFLEX MICROSCOPIC
Bilirubin Urine: NEGATIVE
Glucose, UA: NEGATIVE mg/dL
Hgb urine dipstick: NEGATIVE
Ketones, ur: NEGATIVE mg/dL
Leukocytes,Ua: NEGATIVE
Nitrite: NEGATIVE
Protein, ur: NEGATIVE mg/dL
Specific Gravity, Urine: 1.024 (ref 1.005–1.030)
pH: 7 (ref 5.0–8.0)

## 2021-01-25 LAB — RESP PANEL BY RT-PCR (FLU A&B, COVID) ARPGX2
Influenza A by PCR: NEGATIVE
Influenza B by PCR: NEGATIVE
SARS Coronavirus 2 by RT PCR: NEGATIVE

## 2021-01-25 MED ORDER — ALBUTEROL SULFATE HFA 108 (90 BASE) MCG/ACT IN AERS
2.0000 | INHALATION_SPRAY | Freq: Four times a day (QID) | RESPIRATORY_TRACT | Status: DC | PRN
  Administered 2021-01-25: 2 via RESPIRATORY_TRACT
  Filled 2021-01-25: qty 6.7
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
## 2021-01-25 NOTE — Discharge Instructions (Addendum)
Take Tylenol and ibuprofen as needed for pain. You can use the rescue inhaler as needed for shortness of breath. Please call the health community and wellness center to establish care with a primary care doctor.  You will need to be seen and likely started on additional asthma medication as needed. You may also go to the health center to be tested for additional STDs if needed.  Check MyChart for your gonorrhea and Chlamydia result.

## 2021-01-26 LAB — GC/CHLAMYDIA PROBE AMP (~~LOC~~) NOT AT ARMC
Chlamydia: NEGATIVE
Comment: NEGATIVE
Comment: NORMAL
Neisseria Gonorrhea: NEGATIVE

## 2021-02-05 ENCOUNTER — Other Ambulatory Visit: Payer: Self-pay

## 2021-02-05 ENCOUNTER — Encounter (HOSPITAL_COMMUNITY): Payer: Self-pay

## 2021-02-05 ENCOUNTER — Emergency Department (HOSPITAL_COMMUNITY)
Admission: EM | Admit: 2021-02-05 | Discharge: 2021-02-06 | Disposition: A | Discharge status: Home / Self Care | Payer: Medicaid Other | Attending: Emergency Medicine | Admitting: Emergency Medicine

## 2021-02-05 DIAGNOSIS — Z202 Contact with and (suspected) exposure to infections with a predominantly sexual mode of transmission: Secondary | ICD-10-CM | POA: Diagnosis present

## 2021-02-05 DIAGNOSIS — J45909 Unspecified asthma, uncomplicated: Secondary | ICD-10-CM | POA: Diagnosis not present

## 2021-02-05 DIAGNOSIS — N4889 Other specified disorders of penis: Secondary | ICD-10-CM | POA: Insufficient documentation

## 2021-02-05 DIAGNOSIS — F17200 Nicotine dependence, unspecified, uncomplicated: Secondary | ICD-10-CM | POA: Diagnosis not present

## 2021-02-05 DIAGNOSIS — Z711 Person with feared health complaint in whom no diagnosis is made: Secondary | ICD-10-CM

## 2021-02-05 MED ORDER — ALBUTEROL SULFATE HFA 108 (90 BASE) MCG/ACT IN AERS
2.0000 | INHALATION_SPRAY | RESPIRATORY_TRACT | Status: DC | PRN
  Administered 2021-02-06: 2 via RESPIRATORY_TRACT
  Filled 2021-02-05: qty 6.7
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
## 2021-02-05 NOTE — ED Triage Notes (Signed)
Pt reports bumps on his penis and testicles that began 2 weeks ago.

## 2021-02-06 LAB — URINALYSIS, ROUTINE W REFLEX MICROSCOPIC
Bilirubin Urine: NEGATIVE
Glucose, UA: NEGATIVE mg/dL
Hgb urine dipstick: NEGATIVE
Ketones, ur: NEGATIVE mg/dL
Leukocytes,Ua: NEGATIVE
Nitrite: NEGATIVE
Protein, ur: NEGATIVE mg/dL
Specific Gravity, Urine: 1.013 (ref 1.005–1.030)
pH: 6 (ref 5.0–8.0)

## 2021-02-06 LAB — RPR: RPR Ser Ql: NONREACTIVE

## 2021-02-06 LAB — HIV ANTIBODY (ROUTINE TESTING W REFLEX): HIV Screen 4th Generation wRfx: NONREACTIVE

## 2021-03-17 ENCOUNTER — Ambulatory Visit (HOSPITAL_COMMUNITY)
Admission: EM | Admit: 2021-03-17 | Discharge: 2021-03-17 | Disposition: A | Discharge status: Home / Self Care | Payer: Medicaid Other | Attending: Emergency Medicine | Admitting: Emergency Medicine

## 2021-03-17 ENCOUNTER — Encounter (HOSPITAL_COMMUNITY): Payer: Self-pay | Admitting: Emergency Medicine

## 2021-03-17 ENCOUNTER — Other Ambulatory Visit: Payer: Self-pay

## 2021-03-17 ENCOUNTER — Ambulatory Visit (INDEPENDENT_AMBULATORY_CARE_PROVIDER_SITE_OTHER): Payer: Medicaid Other

## 2021-03-17 DIAGNOSIS — R0782 Intercostal pain: Secondary | ICD-10-CM

## 2021-03-17 DIAGNOSIS — R0781 Pleurodynia: Secondary | ICD-10-CM
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
## 2021-03-17 NOTE — Discharge Instructions (Addendum)
Your vital signs today are stable.  Your chest x-ray does not reveal any sign of acute trauma or heart or lung disease.  If you do not tolerate ibuprofen or Tylenol, we are not able to accommodate you with any treatment for your pain.  Follow-up with your primary care provider for further evaluation and treatment options.  If you do not have a primary care provider, we will help you find one.
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
## 2021-10-11 ENCOUNTER — Emergency Department
Admission: EM | Admit: 2021-10-11 | Discharge: 2021-10-11 | Disposition: A | Discharge status: Home / Self Care | Payer: Medicaid Other | Attending: Emergency Medicine | Admitting: Emergency Medicine

## 2021-10-11 ENCOUNTER — Other Ambulatory Visit: Payer: Self-pay

## 2021-10-11 DIAGNOSIS — H7292 Unspecified perforation of tympanic membrane, left ear: Secondary | ICD-10-CM | POA: Insufficient documentation

## 2021-10-11 DIAGNOSIS — H9202 Otalgia, left ear: Secondary | ICD-10-CM | POA: Diagnosis present

## 2021-10-11 MED ORDER — OFLOXACIN 0.3 % OT SOLN: 5.0000 [drp] | Freq: Two times a day (BID) | OTIC | 0 refills | Status: AC

## 2021-10-11 NOTE — ED Triage Notes (Signed)
Pt states he used a q-tip to clean his ears today and since having pain in the left ear and muffed sound  ?

## 2021-10-11 NOTE — ED Provider Notes (Signed)
? ?[facility] ?Provider Note ? ?Patient Contact: 7:53 PM (approximate) ? ? ?History  ? ?Ear Pain ? ? ?HPI ? ?Zachary Hood is a 36 y.o. male presents to the emergency department with left ear pain after using a Q-tip at home.  Patient has had blood in external auditory canal. ? ?  ? ? ?Physical Exam  ? ?Triage Vital Signs: ?ED Triage Vitals  ?Enc Vitals Group  ?   BP 10/11/21 1831 (!) 146/85  ?   Pulse Rate 10/11/21 1831 70  ?   Resp 10/11/21 1831 18  ?   Temp 10/11/21 1831 98.4 ?F (36.9 ?C)  ?   Temp Source 10/11/21 1831 Oral  ?   SpO2 10/11/21 1831 97 %  ?   Weight 10/11/21 1831 152 lb (68.9 kg)  ?   Height 10/11/21 1831 5\' 7"  (1.702 m)  ?   Head Circumference --   ?   Peak Flow --   ?   Pain Score 10/11/21 1836 7  ?   Pain Loc --   ?   Pain Edu? --   ?   Excl. in GC? --   ? ? ?Most recent vital signs: ?Vitals:  ? 10/11/21 1831  ?BP: (!) 146/85  ?Pulse: 70  ?Resp: 18  ?Temp: 98.4 ?F (36.9 ?C)  ?SpO2: 97%  ? ? ? ?General: Alert and in no acute distress. ?Eyes:  PERRL. EOMI. ?Head: No acute traumatic findings ?ENT: ?     Ears: Left TM is perforated with evidence of blood in external auditory canal. ?     Nose: No congestion/rhinnorhea. ?     Mouth/Throat: Mucous membranes are moist.  ?Neck: No stridor. No cervical spine tenderness to palpation. ?Cardiovascular:  Good peripheral perfusion ?Respiratory: Normal respiratory effort without tachypnea or retractions. Lungs CTAB. Good air entry to the bases with no decreased or absent breath sounds. ?Musculoskeletal: Full range of motion to all extremities.  ?Neurologic:  No gross focal neurologic deficits are appreciated.  ?Skin:   No rash noted ?Other: ? ? ?ED Results / Procedures / Treatments  ? ?Labs ?(all labs ordered are listed, but only abnormal results are displayed) ?Labs Reviewed - No data to display ? ? ? ? ?PROCEDURES: ? ?Critical Care performed: No ? ?Procedures ? ? ?MEDICATIONS ORDERED IN ED: ?Medications - No data to  display ? ? ?IMPRESSION / MDM / ASSESSMENT AND PLAN / ED COURSE  ?I reviewed the triage vital signs and the nursing notes. ?             ?               ? ?Assessment and plan ?TM perforation ?36 year old male presents to the emergency department with left ear pain.  Physical exam findings are consistent with a left TM perforation.  Patient was discharged with ofloxacin and advised to take Tylenol and ibuprofen alternating for pain. ? ? ?FINAL CLINICAL IMPRESSION(S) / ED DIAGNOSES  ? ?Final diagnoses:  ?Perforation of left tympanic membrane  ? ? ? ?Rx / DC Orders  ? ?ED Discharge Orders   ? ?      Ordered  ?  ofloxacin (FLOXIN) 0.3 % OTIC solution  2 times daily       ? 10/11/21 1949  ? ?  ?  ? ?  ? ? ? ?Note:  This document was prepared using Dragon voice recognition software and may include unintentional dictation errors. ?  ?12/11/21 M,  PA-C ?10/11/21 1956 ? ?  ?Gilles Chiquito, MD ?10/11/21 2303 ? ?

## 2021-10-11 NOTE — Discharge Instructions (Addendum)
Apply 5 drops of Ofloxacin to left ear twice daily for seven days.  ?

## 2021-10-27 ENCOUNTER — Other Ambulatory Visit: Payer: Self-pay

## 2021-10-27 ENCOUNTER — Emergency Department
Admission: EM | Admit: 2021-10-27 | Discharge: 2021-10-27 | Disposition: A | Discharge status: Home / Self Care | Payer: Medicaid Other | Attending: Emergency Medicine | Admitting: Emergency Medicine

## 2021-10-27 ENCOUNTER — Emergency Department: Payer: Medicaid Other

## 2021-10-27 DIAGNOSIS — Z23 Encounter for immunization: Secondary | ICD-10-CM | POA: Diagnosis not present

## 2021-10-27 DIAGNOSIS — S6991XA Unspecified injury of right wrist, hand and finger(s), initial encounter: Secondary | ICD-10-CM | POA: Diagnosis present

## 2021-10-27 DIAGNOSIS — W228XXA Striking against or struck by other objects, initial encounter: Secondary | ICD-10-CM | POA: Diagnosis not present

## 2021-10-27 DIAGNOSIS — S61411A Laceration without foreign body of right hand, initial encounter: Secondary | ICD-10-CM | POA: Insufficient documentation

## 2021-10-27 MED ORDER — TETANUS-DIPHTH-ACELL PERTUSSIS 5-2.5-18.5 LF-MCG/0.5 IM SUSY
0.5000 mL | PREFILLED_SYRINGE | Freq: Once | INTRAMUSCULAR | Status: AC
  Administered 2021-10-27: 0.5 mL via INTRAMUSCULAR
  Filled 2021-10-27: qty 0.5

## 2021-10-27 MED ORDER — LIDOCAINE-EPINEPHRINE (PF) 2 %-1:200000 IJ SOLN
10.0000 mL | Freq: Once | INTRAMUSCULAR | Status: DC
  Filled 2021-10-27: qty 20

## 2021-10-27 NOTE — ED Provider Notes (Signed)
? ?[facility] ?Provider Note ? ? ? Event Date/Time  ? First MD Initiated Contact with Patient 10/27/21 2016   ?  (approximate) ? ? ?History  ? ?Hand Injury ? ? ?HPI ? ?Zachary Hood is a 36 y.o. male with a past medical history of adjustment disorder who presents today for evaluation of hand laceration.  Patient reports that he was upset and he punched a stone wall.  He reports that he sustained a laceration to his hand.  He is unsure of his last tetanus shot.  He denies numbness or tingling.  He is able to move all of his fingers and rest of hand normally.  No other injury sustained.  Denies SI/HI. ? ?Patient Active Problem List  ? Diagnosis Date Noted  ? 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 ?  ? ?  ? ? ?Physical Exam  ? ?Triage Vital Signs: ?ED Triage Vitals [10/27/21 1906]  ?Enc Vitals Group  ?   BP (!) 127/92  ?   Pulse Rate 65  ?   Resp 16  ?   Temp 97.8 ?F (36.6 ?C)  ?   Temp Source Oral  ?   SpO2 97 %  ?   Weight   ?   Height   ?   Head Circumference   ?   Peak Flow   ?   Pain Score 9  ?   Pain Loc   ?   Pain Edu?   ?   Excl. in GC?   ? ? ?Most recent vital signs: ?Vitals:  ? 10/27/21 1906  ?BP: (!) 127/92  ?Pulse: 65  ?Resp: 16  ?Temp: 97.8 ?F (36.6 ?C)  ?SpO2: 97%  ? ? ?Physical Exam ?Vitals and nursing note reviewed.  ?Constitutional:   ?   General: Awake and alert. No acute distress. ?   Appearance: Normal appearance. He is well-developed and normal weight.  ?HENT:  ?   Head: Normocephalic and atraumatic.  ?   Mouth/Throat:  ?   Mouth: Mucous membranes are moist.  ?Eyes:  ?   General: PERRL. Normal EOMs     ?   Right eye: No discharge.     ?   Left eye: No discharge.  ?   Conjunctiva/sclera: Conjunctivae normal.  ?Cardiovascular:  ?   Rate and Rhythm: Normal rate and regular rhythm.  ?   Pulses: Normal pulses.  ?   Heart sounds: Normal heart sounds ?Pulmonary:  ?   Effort: Pulmonary effort is normal. No respiratory distress.  ?   Breath  sounds: Normal breath sounds.  ?Abdominal:  ?   Abdomen is soft. There is no abdominal tenderness. Musculoskeletal:     ?   General: No swelling. Normal range of motion.  ?   Cervical back: Normal range of motion and neck supple.  ?Right hand with 3 cm laceration to the dorsum of the hand between the first and second MCP.  Hemostatic.  Normal intrinsic muscle function of the hand.  Able to give thumbs up, cross fingers, make okay sign and hold closed against resistance.  No swelling or bony tenderness. ?Skin: ?   General: Skin is warm and dry.  ?   Capillary Refill: Capillary refill takes less than 2 seconds.  ?   Findings: No rash.  ?Neurological:  ?   Mental Status: He is alert.  ?  ? ? ?ED Results / Procedures / Treatments  ? ?Labs ?(  all labs ordered are listed, but only abnormal results are displayed) ?Labs Reviewed - No data to display ? ? ?EKG ? ? ? ? ?RADIOLOGY ?I independently reviewed the patient's imaging and agree with radiologist's findings ? ? ? ?PROCEDURES: ? ?Critical Care performed:  ? ?Marland Kitchen.Laceration Repair ? ?Date/Time: 10/27/2021 9:37 PM ?Performed by: Jackelyn Hoehn, PA-C ?Authorized by: Jackelyn Hoehn, PA-C  ? ?Consent:  ?  Consent obtained:  Verbal ?  Consent given by:  Patient ?  Risks, benefits, and alternatives were discussed: yes   ?  Risks discussed:  Infection, need for additional repair, nerve damage, pain, poor cosmetic result, poor wound healing, vascular damage, tendon damage and retained foreign body ?Universal protocol:  ?  Procedure explained and questions answered to patient or proxy's satisfaction: yes   ?  Relevant documents present and verified: yes   ?  Test results available: yes   ?  Imaging studies available: yes   ?  Required blood products, implants, devices, and special equipment available: yes   ?  Site/side marked: yes   ?  Immediately prior to procedure, a time out was called: yes   ?  Patient identity confirmed:  Verbally with patient ?Anesthesia:  ?  Anesthesia method:   Local infiltration ?Laceration details:  ?  Location:  Hand ?  Hand location:  R hand, dorsum ?  Length (cm):  3 ?  Depth (mm):  2 ?Pre-procedure details:  ?  Preparation:  Patient was prepped and draped in usual sterile fashion and imaging obtained to evaluate for foreign bodies ?Exploration:  ?  Limited defect created (wound extended): no   ?  Hemostasis achieved with:  Direct pressure and epinephrine ?  Imaging obtained: x-ray   ?  Imaging outcome: foreign body not noted   ?  Wound exploration: wound explored through full range of motion and entire depth of wound visualized   ?  Wound extent: no foreign bodies/material noted, no muscle damage noted, no nerve damage noted, no tendon damage noted, no underlying fracture noted and no vascular damage noted   ?  Contaminated: no   ?Treatment:  ?  Area cleansed with:  Saline ?  Amount of cleaning:  Extensive ?  Irrigation solution:  Tap water and sterile saline ?  Irrigation method:  Pressure wash ?  Visualized foreign bodies/material removed: no   ?  Debridement:  None ?  Undermining:  None ?Skin repair:  ?  Repair method:  Sutures ?  Suture size:  5-0 ?  Suture material:  Nylon ?  Suture technique:  Simple interrupted ?  Number of sutures:  5 ?Approximation:  ?  Approximation:  Close ?Repair type:  ?  Repair type:  Simple ?Post-procedure details:  ?  Dressing:  Non-adherent dressing and tube gauze ?  Procedure completion:  Tolerated well, no immediate complications ? ? ?MEDICATIONS ORDERED IN ED: ?Medications  ?lidocaine-EPINEPHrine (XYLOCAINE W/EPI) 2 %-1:200000 (PF) injection 10 mL (has no administration in time range)  ?Tdap (BOOSTRIX) injection 0.5 mL (0.5 mLs Intramuscular Given 10/27/21 2049)  ? ? ? ?IMPRESSION / MDM / ASSESSMENT AND PLAN / ED COURSE  ?I reviewed the triage vital signs and the nursing notes. ? ? ?Differential diagnosis includes, but is not limited to, laceration, boxer's fracture, tendon injury, retained foreign body.  Patient is awake and  alert, hemodynamically stable and neurovascularly intact.  He is able to flex and extend all fingers normally at isolated PIP and DIP against resistance, do  not suspect ligament injury.  No bony tenderness, x-rays negative for fracture or retained foreign body.  Wound was anesthetized and irrigated extensively.  Full depth of wound was visualized and explored, no retained foreign body.  His tetanus was updated.  Wound was closed with sutures.  We discussed suture care and timeline for removal.  We also discussed return precautions.  Patient understands and agrees with plan.  Discharged in stable condition. ?  ? ? ?FINAL CLINICAL IMPRESSION(S) / ED DIAGNOSES  ? ?Final diagnoses:  ?Laceration of right hand without foreign body, initial encounter  ? ? ? ?Rx / DC Orders  ? ?ED Discharge Orders   ? ? None  ? ?  ? ? ? ?Note:  This document was prepared using Dragon voice recognition software and may include unintentional dictation errors. ?  ?Jackelyn Hoehn, PA-C ?10/27/21 2150 ? ?  ?Phineas Semen, MD ?10/27/21 2212 ? ?

## 2021-10-27 NOTE — ED Triage Notes (Signed)
Pt presents to ER c/o right hand lac after punching a brick wall.  Pt noted to have 3 cm lac between right between pointer finger and middle finger.  Bleeding controlled at this time.  Pt able to move all fingers at this time. No other injuries noted.  Pt A&O x4 in NAD.   ?

## 2021-10-27 NOTE — Discharge Instructions (Signed)
You were evaluated in the emergency department for a laceration. It was repaired with sutures. Keep the area clean and dry.  Wash multiple times per day with soap and water.  Do not go into the ocean or swimming pool.  Return to the emergency department or your primary care physician's office in 7 days for suture removal.  Return to the emergency department for:  -- Fever > 100.4F -- Increase pain in the wound -- Increase redness and swelling -- Pus coming from the wound -- Wound bleeds more than a small amount or it does not stop -- Wound edges come apart -- Severe pain -- Weakness or numbness in the affected area  Or any other new or worsening symptoms. It was a pleasure caring for you.  

## 2021-10-27 NOTE — ED Notes (Signed)
Pt not in room and unable to provide dc counseling ?

## 2021-12-15 IMAGING — DX DG FOREARM 2V*L*
2 series · 2 of 2 positions shown · non-contrast
Comparison: 08/08/2015

CLINICAL DATA: Foreign body insertion in left forearm

EXAM:
LEFT FOREARM - 2 VIEW

[forearm ap]
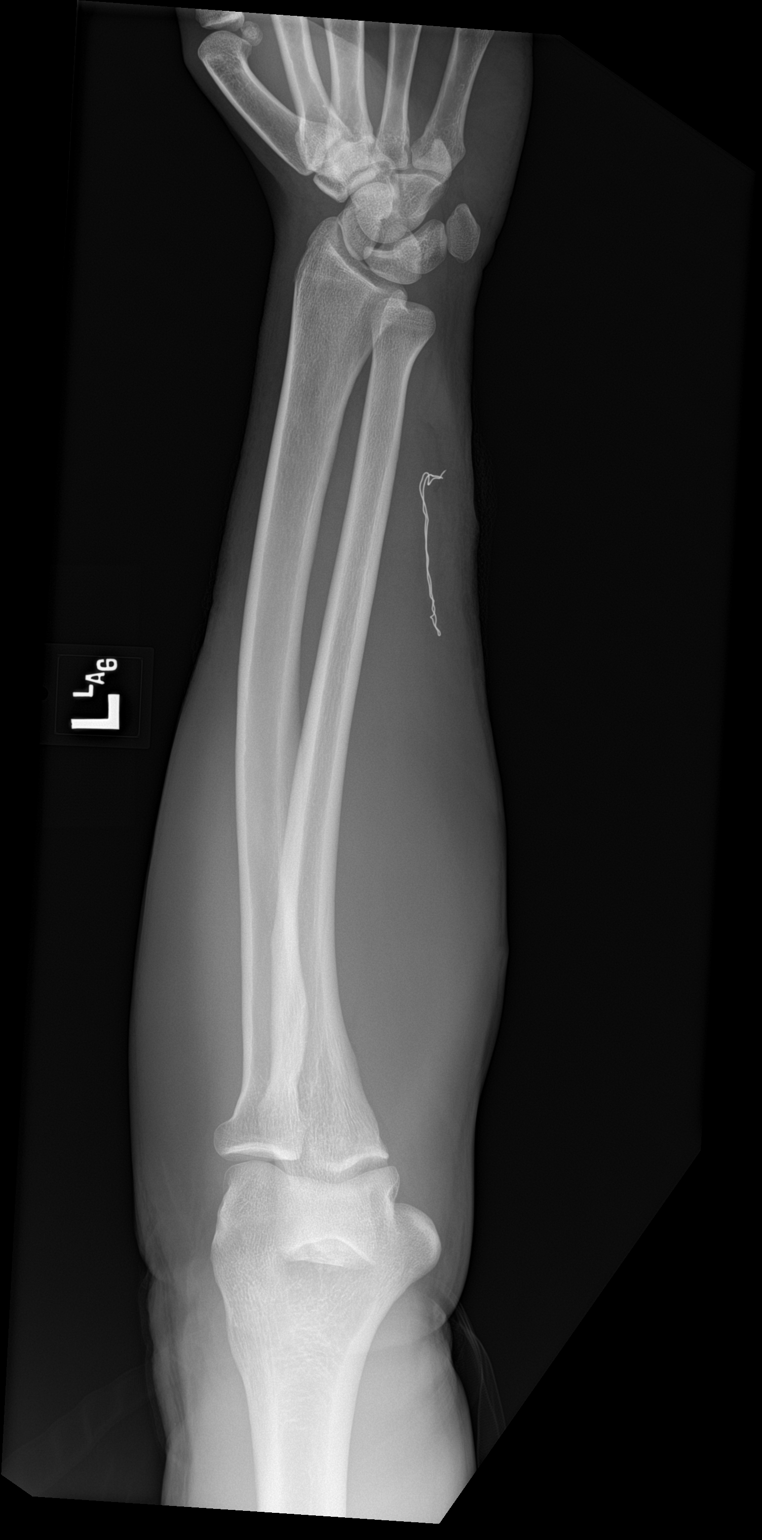

[forearm lat]
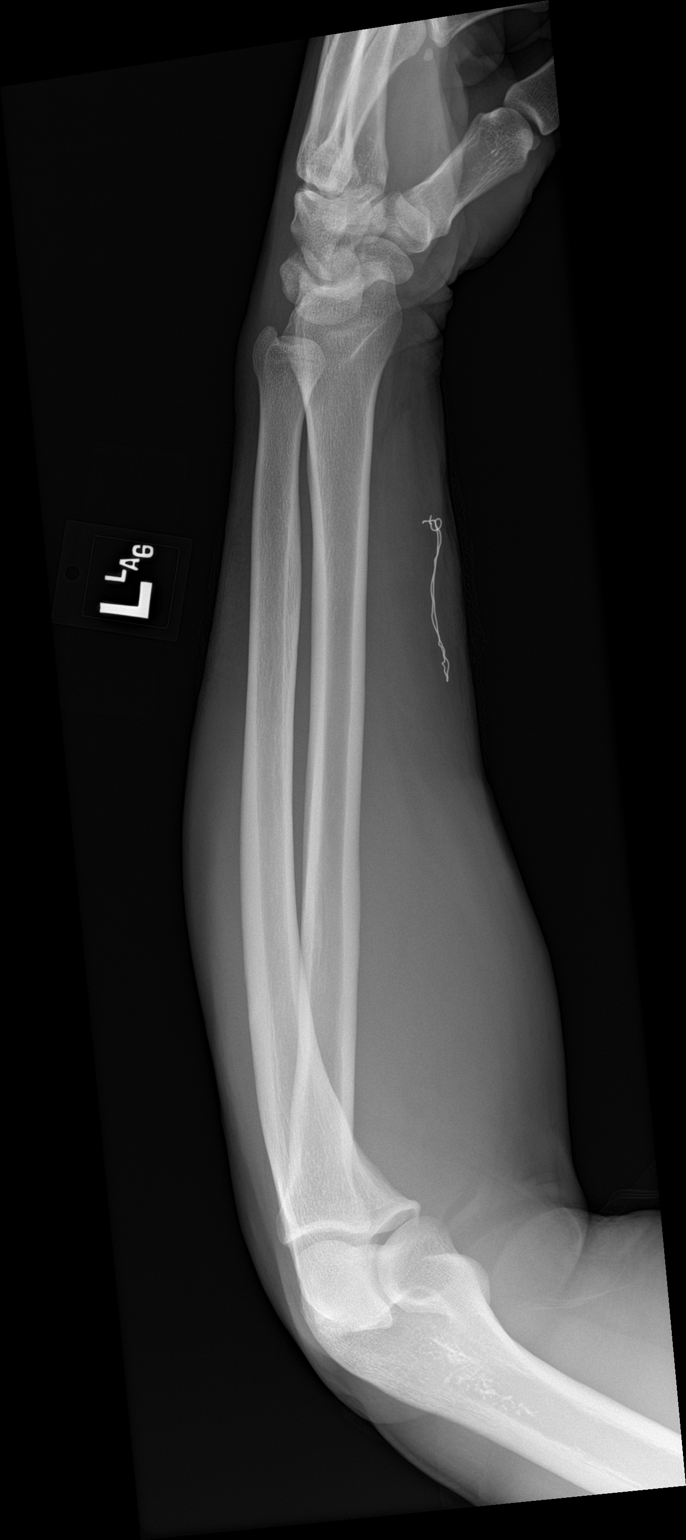

[2 of 2 positions shown; findings below may reference images not displayed]

FINDINGS: Frontal and lateral views of the left forearm are obtained. There is
a serpiginous metallic foreign body within the volar aspect distal
left forearm, measuring approximately 4.6 cm in length. No acute
bony abnormalities. Distal forearm soft tissue swelling.
IMPRESSION: 1. Serpiginous linear metallic foreign body within the volar soft
tissues of the distal left forearm.

## 2022-02-07 ENCOUNTER — Emergency Department
Admission: EM | Admit: 2022-02-07 | Discharge: 2022-02-07 | Discharge status: Against Medical Advice | Payer: Medicaid Other | Attending: Emergency Medicine | Admitting: Emergency Medicine

## 2022-02-07 ENCOUNTER — Encounter: Payer: Self-pay | Admitting: Emergency Medicine

## 2022-02-07 ENCOUNTER — Emergency Department: Payer: Medicaid Other

## 2022-02-07 DIAGNOSIS — R079 Chest pain, unspecified: Secondary | ICD-10-CM | POA: Diagnosis not present

## 2022-02-07 DIAGNOSIS — Z5321 Procedure and treatment not carried out due to patient leaving prior to being seen by health care provider: Secondary | ICD-10-CM | POA: Insufficient documentation

## 2022-02-07 LAB — BASIC METABOLIC PANEL
Anion gap: 9 (ref 5–15)
BUN: 16 mg/dL (ref 6–20)
CO2: 23 mmol/L (ref 22–32)
Calcium: 8.8 mg/dL — ABNORMAL LOW (ref 8.9–10.3)
Chloride: 105 mmol/L (ref 98–111)
Creatinine, Ser: 1.22 mg/dL (ref 0.61–1.24)
GFR, Estimated: >60 mL/min (ref >60)
Glucose, Bld: 117 mg/dL — ABNORMAL HIGH (ref 70–99)
Potassium: 3.1 mmol/L — ABNORMAL LOW (ref 3.5–5.1)
Sodium: 137 mmol/L (ref 135–145)

## 2022-02-07 LAB — CBC
HCT: 44 % (ref 39.0–52.0)
Hemoglobin: 13.8 g/dL (ref 13.0–17.0)
MCH: 23.6 pg — ABNORMAL LOW (ref 26.0–34.0)
MCHC: 31.4 g/dL (ref 30.0–36.0)
MCV: 75.2 fL — ABNORMAL LOW (ref 80.0–100.0)
Platelets: 252 10*3/uL (ref 150–400)
RBC: 5.85 MIL/uL — ABNORMAL HIGH (ref 4.22–5.81)
RDW: 14.5 % (ref 11.5–15.5)
WBC: 11.2 10*3/uL — ABNORMAL HIGH (ref 4.0–10.5)
nRBC: 0 % (ref 0.0–0.2)

## 2022-02-07 LAB — TROPONIN I (HIGH SENSITIVITY): Troponin I (High Sensitivity): 4 ng/L (ref <18)

## 2022-02-07 NOTE — ED Provider Triage Note (Signed)
Emergency Medicine Provider Triage Evaluation Note  HARVIE MORUA, a 36 y.o. male  was evaluated in triage.  Pt complains of chest pain with onset today.  Patient denies any nausea, vomiting, or diaphoresis..  Review of Systems  Positive: CP Negative: SOB  Physical Exam  BP 134/85 (BP Location: Left Arm)   Pulse 84   Temp 98.3 F (36.8 C) (Oral)   Resp 18   Ht 5\' 7"  (1.702 m)   Wt 72.6 kg   SpO2 99%   BMI 25.06 kg/m  Gen:   Awake, no distress  NAD Resp:  Normal effort CATA MSK:   Moves extremities without difficulty  CVS:  RRR  Medical Decision Making  Medically screening exam initiated at 8:03 PM.  Appropriate orders placed.  Amalio Q Orndoff was informed that the remainder of the evaluation will be completed by another provider, this initial triage assessment does not replace that evaluation, and the importance of remaining in the ED until their evaluation is complete.  Patient to the ED for evaluation of central chest pain with onset today.   Rema Jasmine, PA-C 02/07/22 2004

## 2022-02-07 NOTE — ED Notes (Signed)
No answer when called several times from lobby; no answer when phone # listed in chart called 

## 2022-02-09 ENCOUNTER — Other Ambulatory Visit: Payer: Self-pay

## 2022-02-09 ENCOUNTER — Emergency Department
Admission: EM | Admit: 2022-02-09 | Discharge: 2022-02-09 | Disposition: A | Discharge status: Home / Self Care | Payer: Medicaid Other | Attending: Emergency Medicine | Admitting: Emergency Medicine

## 2022-02-09 ENCOUNTER — Emergency Department: Payer: Medicaid Other

## 2022-02-09 DIAGNOSIS — R079 Chest pain, unspecified: Secondary | ICD-10-CM

## 2022-02-09 DIAGNOSIS — R0789 Other chest pain: Secondary | ICD-10-CM | POA: Diagnosis present

## 2022-02-09 DIAGNOSIS — J45909 Unspecified asthma, uncomplicated: Secondary | ICD-10-CM | POA: Insufficient documentation

## 2022-02-09 LAB — CBC
HCT: 40.7 % (ref 39.0–52.0)
Hemoglobin: 12.9 g/dL — ABNORMAL LOW (ref 13.0–17.0)
MCH: 23.8 pg — ABNORMAL LOW (ref 26.0–34.0)
MCHC: 31.7 g/dL (ref 30.0–36.0)
MCV: 75.1 fL — ABNORMAL LOW (ref 80.0–100.0)
Platelets: 227 10*3/uL (ref 150–400)
RBC: 5.42 MIL/uL (ref 4.22–5.81)
RDW: 14.4 % (ref 11.5–15.5)
WBC: 9.2 10*3/uL (ref 4.0–10.5)
nRBC: 0 % (ref 0.0–0.2)

## 2022-02-09 LAB — BASIC METABOLIC PANEL
Anion gap: 8 (ref 5–15)
BUN: 11 mg/dL (ref 6–20)
CO2: 24 mmol/L (ref 22–32)
Calcium: 8.8 mg/dL — ABNORMAL LOW (ref 8.9–10.3)
Chloride: 109 mmol/L (ref 98–111)
Creatinine, Ser: 0.86 mg/dL (ref 0.61–1.24)
GFR, Estimated: >60 mL/min (ref >60)
Glucose, Bld: 88 mg/dL (ref 70–99)
Potassium: 3.7 mmol/L (ref 3.5–5.1)
Sodium: 141 mmol/L (ref 135–145)

## 2022-02-09 LAB — TROPONIN I (HIGH SENSITIVITY): Troponin I (High Sensitivity): 4 ng/L (ref <18)
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
## 2022-02-09 NOTE — Discharge Instructions (Signed)
Your blood work EKG and chest x-ray were all reassuring.  We do not know exactly what is causing your chest pain.  Please follow-up with primary care to establish care.  Please follow-up with cardiology in regards to your chest pain.
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
## 2022-04-26 ENCOUNTER — Other Ambulatory Visit: Payer: Self-pay

## 2022-04-26 ENCOUNTER — Emergency Department (HOSPITAL_COMMUNITY)
Admission: EM | Admit: 2022-04-26 | Discharge: 2022-04-27 | Disposition: A | Discharge status: Other Acute Inpatient Hospital | Payer: MEDICAID | Attending: Emergency Medicine | Admitting: Emergency Medicine

## 2022-04-26 DIAGNOSIS — F4310 Post-traumatic stress disorder, unspecified: Secondary | ICD-10-CM | POA: Insufficient documentation

## 2022-04-26 DIAGNOSIS — F411 Generalized anxiety disorder: Secondary | ICD-10-CM | POA: Insufficient documentation

## 2022-04-26 DIAGNOSIS — R45851 Suicidal ideations: Secondary | ICD-10-CM

## 2022-04-26 DIAGNOSIS — D72829 Elevated white blood cell count, unspecified: Secondary | ICD-10-CM | POA: Insufficient documentation

## 2022-04-26 DIAGNOSIS — Z1152 Encounter for screening for COVID-19: Secondary | ICD-10-CM | POA: Insufficient documentation

## 2022-04-26 DIAGNOSIS — F319 Bipolar disorder, unspecified: Secondary | ICD-10-CM

## 2022-04-26 DIAGNOSIS — Z046 Encounter for general psychiatric examination, requested by authority: Secondary | ICD-10-CM | POA: Diagnosis present

## 2022-04-26 LAB — COMPREHENSIVE METABOLIC PANEL
ALT: 36 U/L (ref 0–44)
AST: 34 U/L (ref 15–41)
Albumin: 4.2 g/dL (ref 3.5–5.0)
Alkaline Phosphatase: 65 U/L (ref 38–126)
Anion gap: 11 (ref 5–15)
BUN: 10 mg/dL (ref 6–20)
CO2: 24 mmol/L (ref 22–32)
Calcium: 9.8 mg/dL (ref 8.9–10.3)
Chloride: 106 mmol/L (ref 98–111)
Creatinine, Ser: 1.07 mg/dL (ref 0.61–1.24)
GFR, Estimated: >60 mL/min (ref >60)
Glucose, Bld: 100 mg/dL — ABNORMAL HIGH (ref 70–99)
Potassium: 4 mmol/L (ref 3.5–5.1)
Sodium: 141 mmol/L (ref 135–145)
Total Bilirubin: 1.2 mg/dL (ref 0.3–1.2)
Total Protein: 7.4 g/dL (ref 6.5–8.1)

## 2022-04-26 LAB — RESP PANEL BY RT-PCR (FLU A&B, COVID) ARPGX2
Influenza A by PCR: NEGATIVE
Influenza B by PCR: NEGATIVE
SARS Coronavirus 2 by RT PCR: NEGATIVE

## 2022-04-26 LAB — CBC WITH DIFFERENTIAL/PLATELET
Abs Immature Granulocytes: 0.03 10*3/uL (ref 0.00–0.07)
Basophils Absolute: 0 10*3/uL (ref 0.0–0.1)
Basophils Relative: 0 %
Eosinophils Absolute: 0 10*3/uL (ref 0.0–0.5)
Eosinophils Relative: 0 %
HCT: 43.1 % (ref 39.0–52.0)
Hemoglobin: 14.2 g/dL (ref 13.0–17.0)
Immature Granulocytes: 0 %
Lymphocytes Relative: 10 %
Lymphs Abs: 1 10*3/uL (ref 0.7–4.0)
MCH: 24.4 pg — ABNORMAL LOW (ref 26.0–34.0)
MCHC: 32.9 g/dL (ref 30.0–36.0)
MCV: 73.9 fL — ABNORMAL LOW (ref 80.0–100.0)
Monocytes Absolute: 0.8 10*3/uL (ref 0.1–1.0)
Monocytes Relative: 8 %
Neutro Abs: 8.7 10*3/uL — ABNORMAL HIGH (ref 1.7–7.7)
Neutrophils Relative %: 82 %
Platelets: 260 10*3/uL (ref 150–400)
RBC: 5.83 MIL/uL — ABNORMAL HIGH (ref 4.22–5.81)
RDW: 15.5 % (ref 11.5–15.5)
WBC: 10.6 10*3/uL — ABNORMAL HIGH (ref 4.0–10.5)
nRBC: 0 % (ref 0.0–0.2)

## 2022-04-26 LAB — RAPID URINE DRUG SCREEN, HOSP PERFORMED
Amphetamines: NOT DETECTED
Barbiturates: NOT DETECTED
Benzodiazepines: NOT DETECTED
Cocaine: NOT DETECTED
Opiates: NOT DETECTED
Tetrahydrocannabinol: POSITIVE — AB

## 2022-04-26 LAB — ETHANOL: Alcohol, Ethyl (B): <10 mg/dL (ref <10)

## 2022-04-26 MED ORDER — LAMOTRIGINE ER 100 MG PO TB24
100.0000 mg | ORAL_TABLET | Freq: Every day | ORAL | Status: DC
  Administered 2022-04-27: 100 mg via ORAL
  Filled 2022-04-26: qty 1

## 2022-04-26 MED ORDER — GABAPENTIN 300 MG PO CAPS
300.0000 mg | ORAL_CAPSULE | Freq: Three times a day (TID) | ORAL | Status: DC
  Administered 2022-04-26 – 2022-04-27 (×3): 300 mg via ORAL
  Filled 2022-04-26 (×3): qty 1

## 2022-04-26 MED ORDER — ZIPRASIDONE MESYLATE 20 MG IM SOLR
20.0000 mg | Freq: Two times a day (BID) | INTRAMUSCULAR | Status: DC | PRN
  Filled 2022-04-26: qty 20

## 2022-04-26 MED ORDER — QUETIAPINE FUMARATE ER 200 MG PO TB24
400.0000 mg | ORAL_TABLET | Freq: Every day | ORAL | Status: DC
  Administered 2022-04-26: 400 mg via ORAL
  Filled 2022-04-26 (×2): qty 2
  Filled 2022-04-26: qty 1

## 2022-04-26 MED ORDER — LORAZEPAM 1 MG PO TABS
2.0000 mg | ORAL_TABLET | Freq: Three times a day (TID) | ORAL | Status: DC | PRN
  Filled 2022-04-26: qty 2

## 2022-04-26 MED ORDER — LORAZEPAM 1 MG PO TABS
1.0000 mg | ORAL_TABLET | Freq: Once | ORAL | Status: AC
  Administered 2022-04-26: 1 mg via ORAL
  Filled 2022-04-26: qty 1

## 2022-04-26 MED ORDER — STERILE WATER FOR INJECTION IJ SOLN
INTRAMUSCULAR | Status: AC
  Filled 2022-04-26: qty 10

## 2022-04-26 MED ORDER — MIRTAZAPINE 15 MG PO TABS
30.0000 mg | ORAL_TABLET | Freq: Every day | ORAL | Status: DC
  Administered 2022-04-26: 30 mg via ORAL
  Filled 2022-04-26: qty 2

## 2022-04-26 NOTE — ED Notes (Signed)
IVC paperwork is done in the yellow zone purple side in purple clipboard

## 2022-04-26 NOTE — ED Notes (Addendum)
Pt visiting with family; sitter at bedside; pt asking to speak with psychiatry again; psychiatry aware

## 2022-04-26 NOTE — ED Notes (Signed)
This RN informed visitors that visiting hours are over and they will have to leave; pt states "I'm still trying to talk to psychiatry, we're trying to leave together"; psychiatry notified and is at bedside

## 2022-04-26 NOTE — ED Notes (Signed)
Pt speaking on phone 

## 2022-04-26 NOTE — ED Triage Notes (Signed)
Pt. Stated, I feel  and think about doing stuff to myself and other people. Off my medication for months, I take them off and on. I feel like some medication don't make me feel good. I need to go back to my old medication.

## 2022-04-26 NOTE — ED Notes (Signed)
Pt's girlfriend at bedside.

## 2022-04-26 NOTE — ED Notes (Signed)
Pt speaking on phone with girlfriend

## 2022-04-26 NOTE — ED Provider Triage Note (Signed)
Emergency Medicine Provider Triage Evaluation Note  Zachary Hood , a 36 y.o. male  was evaluated in triage.  Pt complains of suicidal ideations. H/o bipolar. He reports it "is hard for him to explain what is going on in his head". He reports he has not been taking his medications. Reports that they still make him feel bad. Reports "I have trauma and bad anxiety". He reports he has been arguing with his girlfriend. Reports his brother just died.   Review of Systems  Positive:  Negative:  Physical Exam  BP (!) 145/87 (BP Location: Right Arm)   Pulse 72   Temp 98.6 F (37 C)   Resp 18   SpO2 96%  Gen:   Awake, no distress, tearful, will not get off the phone Resp:  Normal effort  MSK:   Moves extremities without difficulty  Other:    Medical Decision Making  Medically screening exam initiated at 7:56 AM.  Appropriate orders placed.  Zachary Hood was informed that the remainder of the evaluation will be completed by another provider, this initial triage assessment does not replace that evaluation, and the importance of remaining in the ED until their evaluation is complete.  Patient here voluntarily. Will order medical clearance labs.    Achille Rich, PA-C 04/26/22 0800

## 2022-04-26 NOTE — ED Notes (Signed)
PATIENTS FIANCE SABRINA CALLED FOR A UPDATE [phone]

## 2022-04-26 NOTE — Progress Notes (Signed)
Pt was accepted to [facility] 04/27/22; [facility]   Pt meets inpatient criteria per [name], NP.    Attending Physician will be Dr. Estill Cotta   Report can be called to: - [phone]* pager    Pt can arrive after 8:00am   Care Team notified: Adonis Brook, RN  Maryjean Ka, MSW, [facility] 04/26/2022 10:29 PM

## 2022-04-26 NOTE — Progress Notes (Signed)
Inpatient Behavioral Health Placement  Pt meets inpatient criteria per [name], NP. There are no available beds at [facility].  Referral was sent to the following facilities;   Destination  Service Provider Address Phone Fax  [facility]  [address] [place], [place] [address] [phone]  [facility]  [address] [place] [address] [phone]  [facility]  [address] [place] [address] [phone]  [facility]  [address] [place] [address] [phone]  [facility]  [address] [place], [place] [address] [phone]  [facility]  [address] [place] [address] [phone]  [facility]  [address] [place] [address] [phone]  [facility]  [address], [place] [address] [phone]  [facility]  [name], [place] [address] [phone]  [facility]  [address], [place] [address] [phone]  [facility]  [address] [place] [address] [phone]  [facility]  [address], [place] [address] [phone]  [facility]  [address], [place] [address] [phone]  [facility]  [address], [place] [address] [phone]  [facility]  [address] [place] [address] [phone]    Situation ongoing,  CSW will follow up.   Maryjean Ka, MSW, [facility] 04/26/2022  @ 10:24 PM

## 2022-04-26 NOTE — ED Notes (Signed)
Pt at nursing station using phone.  
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
## 2022-04-26 NOTE — ED Notes (Signed)
Pt changing into scrubs now.  
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
## 2022-04-26 NOTE — ED Notes (Signed)
Pt wont get off the phone long enough to be triaged.

## 2022-04-26 NOTE — ED Notes (Signed)
Family took pts belongings with them. Pt moved to alcove room. He is calm and respectful att.

## 2022-04-26 NOTE — ED Notes (Signed)
Psychiatry at bedside.

## 2022-04-26 NOTE — ED Notes (Signed)
Pt was at the desk and on the phone talking to someone and started telling the person on the phone that "these nurses aren't watching me, they are letting me do whatever I want to do. I can do whatever I want in here." This RN went over and explained to the pt that we had rules for the the type of evaluation. This RN explained that the patient is allowed 2-5 min phone calls and that he had already been on the phone for 25 mins and needed to hang up the phone. The patient started cussing and telling this RN "that he could do what he wanted and if he wanted to leave right now he could." Pt started calling this RN "a bitch and fuck you and fuck this place." So this RN hung up the phone and told the patient he needed to get back to his bed. Pt continued to escalate and stated call security and watch what I do when they get back here. I will catch a charge in here today. Security now at bedside.

## 2022-04-27 NOTE — ED Provider Notes (Signed)
Emergency Medicine Observation Re-evaluation Note  Zachary Hood is a 36 y.o. male, seen on rounds today.  Pt initially presented to the ED for complaints of Depression and Suicidal Currently, the patient is resting comfortably.  Physical Exam  BP 119/75 (BP Location: Right Arm)   Pulse 63   Temp (!) 97.5 F (36.4 C) (Oral)   Resp 17   Ht 5\' 7"  (1.702 m)   Wt 70.3 kg   SpO2 91%   BMI 24.28 kg/m  Physical Exam General: NAD   ED Course / MDM  EKG:   I have reviewed the labs performed to date as well as medications administered while in observation.  Recent changes in the last 24 hours include no acute events reported.  Plan  Current plan is for placement at [facility] - likely transfer on 11/15 after 8AM.    12/15, MD 04/27/22 [phone]

## 2022-04-27 NOTE — ED Notes (Signed)
IVC expires 05/03/22 

## 2022-04-27 NOTE — ED Notes (Signed)
EDP messaged in regards to pt asking to speak with a provider. Pt states he was not aware of his plan of care and is requesting further answers.

## 2022-04-27 NOTE — ED Notes (Signed)
[facility] paged at provided number [phone], awaiting phone call for report.

## 2022-04-27 NOTE — ED Notes (Signed)
Pt sitting at bedside eating lunch tray. Orange juice given to pt. Pt given update regarding continuation of care and he continues to refuse. Reports he was under the impression that he was going to only stay overnight and was going to be released in the morning. This RN explained to pt his options and his bed placement in another facility. Pt is calm and cooperative but does become agitated when discussing transferring.

## 2022-05-02 ENCOUNTER — Ambulatory Visit: Payer: Medicaid Other | Admitting: Nurse Practitioner
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
## 2022-11-07 ENCOUNTER — Other Ambulatory Visit: Payer: Self-pay

## 2022-11-07 ENCOUNTER — Emergency Department (EMERGENCY_DEPARTMENT_HOSPITAL)
Admission: EM | Admit: 2022-11-07 | Discharge: 2022-11-08 | Disposition: A | Discharge status: Other Acute Inpatient Hospital | Payer: Medicaid Other | Source: Home / Self Care | Attending: Emergency Medicine | Admitting: Emergency Medicine

## 2022-11-07 ENCOUNTER — Encounter (HOSPITAL_COMMUNITY): Payer: Self-pay

## 2022-11-07 DIAGNOSIS — R45851 Suicidal ideations: Secondary | ICD-10-CM

## 2022-11-07 DIAGNOSIS — J45909 Unspecified asthma, uncomplicated: Secondary | ICD-10-CM | POA: Insufficient documentation

## 2022-11-07 DIAGNOSIS — R45850 Homicidal ideations: Secondary | ICD-10-CM

## 2022-11-07 DIAGNOSIS — F4310 Post-traumatic stress disorder, unspecified: Secondary | ICD-10-CM | POA: Insufficient documentation

## 2022-11-07 DIAGNOSIS — F329 Major depressive disorder, single episode, unspecified: Secondary | ICD-10-CM | POA: Insufficient documentation

## 2022-11-07 DIAGNOSIS — F411 Generalized anxiety disorder: Secondary | ICD-10-CM | POA: Insufficient documentation

## 2022-11-07 LAB — COMPREHENSIVE METABOLIC PANEL
ALT: 45 U/L — ABNORMAL HIGH (ref 0–44)
AST: 60 U/L — ABNORMAL HIGH (ref 15–41)
Albumin: 4.7 g/dL (ref 3.5–5.0)
Alkaline Phosphatase: 75 U/L (ref 38–126)
Anion gap: 13 (ref 5–15)
BUN: 16 mg/dL (ref 6–20)
CO2: 23 mmol/L (ref 22–32)
Calcium: 9.6 mg/dL (ref 8.9–10.3)
Chloride: 103 mmol/L (ref 98–111)
Creatinine, Ser: 1.1 mg/dL (ref 0.61–1.24)
GFR, Estimated: >60 mL/min (ref >60)
Glucose, Bld: 87 mg/dL (ref 70–99)
Potassium: 3.9 mmol/L (ref 3.5–5.1)
Sodium: 139 mmol/L (ref 135–145)
Total Bilirubin: 2.1 mg/dL — ABNORMAL HIGH (ref 0.3–1.2)
Total Protein: 8.1 g/dL (ref 6.5–8.1)

## 2022-11-07 LAB — CBC
HCT: 45.2 % (ref 39.0–52.0)
Hemoglobin: 14.3 g/dL (ref 13.0–17.0)
MCH: 24 pg — ABNORMAL LOW (ref 26.0–34.0)
MCHC: 31.6 g/dL (ref 30.0–36.0)
MCV: 75.7 fL — ABNORMAL LOW (ref 80.0–100.0)
Platelets: 273 10*3/uL (ref 150–400)
RBC: 5.97 MIL/uL — ABNORMAL HIGH (ref 4.22–5.81)
RDW: 15 % (ref 11.5–15.5)
WBC: 12.3 10*3/uL — ABNORMAL HIGH (ref 4.0–10.5)
nRBC: 0 % (ref 0.0–0.2)

## 2022-11-07 LAB — RAPID URINE DRUG SCREEN, HOSP PERFORMED
Amphetamines: NOT DETECTED
Barbiturates: NOT DETECTED
Benzodiazepines: NOT DETECTED
Cocaine: NOT DETECTED
Opiates: NOT DETECTED
Tetrahydrocannabinol: POSITIVE — AB

## 2022-11-07 LAB — ACETAMINOPHEN LEVEL: Acetaminophen (Tylenol), Serum: <10 ug/mL — ABNORMAL LOW (ref 10–30)

## 2022-11-07 LAB — SALICYLATE LEVEL: Salicylate Lvl: <7 mg/dL — ABNORMAL LOW (ref 7.0–30.0)

## 2022-11-07 LAB — ETHANOL: Alcohol, Ethyl (B): <10 mg/dL (ref <10)

## 2022-11-07 MED ORDER — LORAZEPAM 1 MG PO TABS
2.0000 mg | ORAL_TABLET | Freq: Four times a day (QID) | ORAL | Status: DC | PRN
  Administered 2022-11-07: 2 mg via ORAL
  Filled 2022-11-07: qty 2

## 2022-11-07 MED ORDER — MIRTAZAPINE 15 MG PO TABS
15.0000 mg | ORAL_TABLET | Freq: Every day | ORAL | Status: DC
  Filled 2022-11-07: qty 1

## 2022-11-07 MED ORDER — QUETIAPINE FUMARATE ER 300 MG PO TB24
300.0000 mg | ORAL_TABLET | Freq: Every day | ORAL | Status: DC
  Filled 2022-11-07 (×3): qty 1

## 2022-11-07 MED ORDER — ZIPRASIDONE MESYLATE 20 MG IM SOLR: 20.0000 mg | Freq: Two times a day (BID) | INTRAMUSCULAR | Status: DC | PRN

## 2022-11-07 MED ORDER — GABAPENTIN 100 MG PO CAPS
200.0000 mg | ORAL_CAPSULE | Freq: Three times a day (TID) | ORAL | Status: DC
  Administered 2022-11-07: 200 mg via ORAL
  Filled 2022-11-07 (×3): qty 2

## 2022-11-07 MED ORDER — QUETIAPINE FUMARATE ER 200 MG PO TB24: 200.0000 mg | ORAL_TABLET | Freq: Every day | ORAL | Status: DC

## 2022-11-07 MED ORDER — LAMOTRIGINE 25 MG PO TABS: 50.0000 mg | ORAL_TABLET | Freq: Every day | ORAL | Status: DC

## 2022-11-07 MED ORDER — LAMOTRIGINE 25 MG PO TABS
25.0000 mg | ORAL_TABLET | Freq: Every day | ORAL | Status: DC
  Administered 2022-11-07: 25 mg via ORAL
  Filled 2022-11-07 (×2): qty 1

## 2022-11-07 NOTE — ED Notes (Signed)
IVC docs e-filed w/magistrate - see ticket receipt and acknowledgment in patient clipboard (now in purple). IVC'd outside of hospital 11/07/22, first exam done on 11/07/22; IVC exp. 11/14/22

## 2022-11-07 NOTE — ED Notes (Signed)
Pt belongings located at [facility] nurse's station without a label. Obtained belongings and inventoried them. See patient belongings documentation
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
## 2022-11-07 NOTE — ED Notes (Signed)
Pt received for care at 1900.  Pt asleep at this time; respirations even and unlabored with no distress noted.  Bed in lowest position, wheels locked.  Pt in view of sitter with necessary precautions maintained.

## 2022-11-07 NOTE — ED Notes (Signed)
Dinner tray ordered for pt

## 2022-11-07 NOTE — ED Notes (Signed)
Pt tearful and upset about missing his daughter's birthday and reports he was on his way to her birthday party when PD picked him up. Pt reports he was under the impression that he was going to come here be seen and get to leave. Pt asking for reevaluation tonight. Explained that the plan is to be reevaluated in the morning. Will pass pt request on to NP.
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
## 2022-11-07 NOTE — ED Notes (Signed)
Called staffing for a sitter, per staffing no sitter until [phone]
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
## 2022-11-07 NOTE — ED Notes (Signed)
This RN attempted to administer pt's ordered night-time medications.  Pt arousable but drowsy; did not stay awake long enough while trying to discuss ordered medications.  Of note, pt received dose of Ativan right before 7 PM.

## 2022-11-07 NOTE — ED Notes (Signed)
Pt came in IVC'd by police. Was seen in triage and first exam done by Dr. Suezanne Jacquet. Paperwork scanned in chart and efile once back from lunch, due to first exam not being started prior. Paperwork also given to rose and first exam done by Dr Jarold Motto. There will be two e-files shown online. Original paperwork given to me will be used in purple zone clipboard. Original in red folder

## 2022-11-07 NOTE — Progress Notes (Signed)
[facility] LCSW Progress Note   11/07/2022    11:31 PM  Zachary Hood   [id number]   Type of Contact and Topic:  Psychiatric Bed Placement   Pt accepted to [facility] 407-1    Patient meets inpatient criteria per [name], NP   The attending provider will be Dr. Sherron Flemings  Call report to [phone]    Hollace Hayward, RN @ [facility] ED notified.     Pt scheduled  to arrive at [facility].   Damita Dunnings, MSW, LCSW-A  11:32 PM 11/07/2022

## 2022-11-07 NOTE — ED Notes (Signed)
Pt given meal and orange juice, okay to give per provider.

## 2022-11-07 NOTE — Progress Notes (Signed)
Patient has been denied by [facility] due to no appropriate beds available. Patient meets BH inpatient criteria per [name], NP. Patient has been faxed out to the following facilities:   [facility]  [address] [place] [place] [address] [phone]  [facility]  [address] [place] [address] [phone]  [facility]  [address], [place] [address] [phone]  [facility]  [address] [place] [address] [phone]  [facility]  [name] [place] [address] [phone]  [facility]  [address] [place], [place] [address] [phone]  [facility]  [address] [place], [place] [address] [phone]  [facility]  [address] [place] [address] [phone]  [facility]  [address], [place] [address] [phone]  [facility]  [address] [place] [address] [phone]    Damita Dunnings, MSW, LCSW-A  8:28 PM 11/07/2022

## 2022-11-08 ENCOUNTER — Encounter (HOSPITAL_COMMUNITY): Payer: Self-pay | Admitting: Nurse Practitioner

## 2022-11-08 ENCOUNTER — Inpatient Hospital Stay (HOSPITAL_COMMUNITY)
Admission: AD | Admit: 2022-11-08 | Discharge: 2022-11-11 | DRG: 885 | Disposition: A | Discharge status: Home / Self Care | Payer: Medicaid Other | Source: Intra-hospital | Attending: Psychiatry | Admitting: Psychiatry

## 2022-11-08 DIAGNOSIS — Z886 Allergy status to analgesic agent status: Secondary | ICD-10-CM

## 2022-11-08 DIAGNOSIS — F319 Bipolar disorder, unspecified: Principal | ICD-10-CM | POA: Diagnosis present

## 2022-11-08 DIAGNOSIS — F3160 Bipolar disorder, current episode mixed, unspecified: Secondary | ICD-10-CM | POA: Diagnosis not present

## 2022-11-08 DIAGNOSIS — R45850 Homicidal ideations: Secondary | ICD-10-CM | POA: Diagnosis present

## 2022-11-08 DIAGNOSIS — Z91148 Patient's other noncompliance with medication regimen for other reason: Secondary | ICD-10-CM | POA: Diagnosis not present

## 2022-11-08 DIAGNOSIS — Z9152 Personal history of nonsuicidal self-harm: Secondary | ICD-10-CM | POA: Diagnosis not present

## 2022-11-08 DIAGNOSIS — F4310 Post-traumatic stress disorder, unspecified: Secondary | ICD-10-CM | POA: Diagnosis not present

## 2022-11-08 DIAGNOSIS — F411 Generalized anxiety disorder: Secondary | ICD-10-CM | POA: Diagnosis present

## 2022-11-08 DIAGNOSIS — Z79899 Other long term (current) drug therapy: Secondary | ICD-10-CM | POA: Diagnosis not present

## 2022-11-08 DIAGNOSIS — R4588 Nonsuicidal self-harm: Secondary | ICD-10-CM | POA: Diagnosis present

## 2022-11-08 DIAGNOSIS — R45851 Suicidal ideations: Secondary | ICD-10-CM | POA: Diagnosis present

## 2022-11-08 DIAGNOSIS — Z87898 Personal history of other specified conditions: Secondary | ICD-10-CM

## 2022-11-08 MED ORDER — ALUM & MAG HYDROXIDE-SIMETH 200-200-20 MG/5ML PO SUSP: 30.0000 mL | ORAL | Status: DC | PRN

## 2022-11-08 MED ORDER — LORAZEPAM 2 MG/ML IJ SOLN: 2.0000 mg | Freq: Four times a day (QID) | INTRAMUSCULAR | Status: DC | PRN

## 2022-11-08 MED ORDER — LAMOTRIGINE 25 MG PO TABS
25.0000 mg | ORAL_TABLET | Freq: Every day | ORAL | Status: DC
  Filled 2022-11-08 (×2): qty 1

## 2022-11-08 MED ORDER — HALOPERIDOL 2 MG PO TABS: 2.0000 mg | ORAL_TABLET | Freq: Four times a day (QID) | ORAL | Status: DC | PRN

## 2022-11-08 MED ORDER — LORAZEPAM 1 MG PO TABS: 2.0000 mg | ORAL_TABLET | Freq: Four times a day (QID) | ORAL | Status: DC | PRN

## 2022-11-08 MED ORDER — QUETIAPINE FUMARATE ER 300 MG PO TB24
300.0000 mg | ORAL_TABLET | Freq: Every day | ORAL | Status: DC
  Administered 2022-11-08 – 2022-11-09 (×2): 300 mg via ORAL
  Filled 2022-11-08 (×5): qty 1

## 2022-11-08 MED ORDER — HALOPERIDOL LACTATE 5 MG/ML IJ SOLN: 2.0000 mg | Freq: Four times a day (QID) | INTRAMUSCULAR | Status: DC | PRN

## 2022-11-08 MED ORDER — DIPHENHYDRAMINE HCL 50 MG/ML IJ SOLN: 50.0000 mg | Freq: Four times a day (QID) | INTRAMUSCULAR | Status: DC | PRN

## 2022-11-08 MED ORDER — LORAZEPAM 1 MG PO TABS
2.0000 mg | ORAL_TABLET | Freq: Four times a day (QID) | ORAL | Status: DC | PRN
  Administered 2022-11-08: 2 mg via ORAL
  Filled 2022-11-08: qty 2

## 2022-11-08 MED ORDER — MAGNESIUM HYDROXIDE 400 MG/5ML PO SUSP: 30.0000 mL | Freq: Every day | ORAL | Status: DC | PRN

## 2022-11-08 MED ORDER — GABAPENTIN 100 MG PO CAPS
200.0000 mg | ORAL_CAPSULE | Freq: Three times a day (TID) | ORAL | Status: DC
  Administered 2022-11-08 – 2022-11-10 (×4): 200 mg via ORAL
  Filled 2022-11-08 (×12): qty 2

## 2022-11-08 MED ORDER — MIRTAZAPINE 15 MG PO TABS
15.0000 mg | ORAL_TABLET | Freq: Every day | ORAL | Status: DC
  Administered 2022-11-08 – 2022-11-10 (×3): 15 mg via ORAL
  Filled 2022-11-08 (×5): qty 1

## 2022-11-08 MED ORDER — DIPHENHYDRAMINE HCL 25 MG PO CAPS: 50.0000 mg | ORAL_CAPSULE | Freq: Four times a day (QID) | ORAL | Status: DC | PRN

## 2022-11-08 MED ORDER — ZIPRASIDONE MESYLATE 20 MG IM SOLR: 20.0000 mg | Freq: Two times a day (BID) | INTRAMUSCULAR | Status: DC | PRN

## 2022-11-08 MED ORDER — EPINEPHRINE 0.3 MG/0.3ML IJ SOAJ: 0.3000 mg | Freq: Every day | INTRAMUSCULAR | Status: DC | PRN

## 2022-11-08 NOTE — ED Provider Notes (Signed)
Emergency Medicine Observation Re-evaluation Note  Zachary Hood is a 37 y.o. male, seen on rounds today.  Pt initially presented to the ED for complaints of Psychiatric Evaluation Currently, the patient is resting and is comfortably asleep.  Physical Exam  BP 128/76 (BP Location: Left Arm)   Pulse 77   Temp 98.4 F (36.9 C) (Oral)   Resp 17   Ht 5\' 7"  (1.702 m)   Wt 70.3 kg   SpO2 100%   BMI 24.28 kg/m  Physical Exam General: No acute agitation, patient sleeping Cardiac: No documented tachycardia overnight Lungs: Metric rise and fall of chest as patient is sleeping comfortably Psych: No agitation at this time  ED Course / MDM  EKG:EKG Interpretation  Date/Time:  Monday Nov 07 2022 16:32:17 EDT Ventricular Rate:  61 PR Interval:  168 QRS Duration: 96 QT Interval:  412 QTC Calculation: 414 R Axis:   68 Text Interpretation: Normal sinus rhythm with sinus arrhythmia Normal ECG When compared with ECG of 26-Apr-2022 13:13, PREVIOUS ECG IS PRESENT Confirmed by Vonita Moss [phone]) on 11/07/2022 4:55:21 PM  I have reviewed the labs performed to date as well as medications administered while in observation.  Recent changes in the last 24 hours include none reported by nursing.  Plan  Current plan is for awaiting inpatient psychiatric management.    Zachary Hood, Canary Brim, MD 11/08/22 [phone]
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
## 2022-11-08 NOTE — Progress Notes (Signed)
Patient observed to be getting agitated while talking on the phone, with voice elevated. PRN lorazepam was offered and patient stated "I'm on edge." Administered at 1455. Patient response has been to sit in the day room, converse calmly and eat snacks.

## 2022-11-08 NOTE — Tx Team (Signed)
Initial Treatment Plan 11/08/2022 3:56 PM Brandonlee JANDEL ECKMAN ZOX:[id number]    PATIENT STRESSORS: Marital or family conflict   Medication change or noncompliance     PATIENT STRENGTHS: Active sense of humor  General fund of knowledge    PATIENT IDENTIFIED PROBLEMS: Agitation                     DISCHARGE CRITERIA:  Improved stabilization in mood, thinking, and/or behavior Verbal commitment to aftercare and medication compliance  PRELIMINARY DISCHARGE PLAN: Outpatient therapy  PATIENT/FAMILY INVOLVEMENT: This treatment plan has been presented to and reviewed with the patient, Krystal Q Armato.  The patient has been given the opportunity to ask questions and make suggestions.  Clinton Gallant, RN 11/08/2022, 3:56 PM

## 2022-11-08 NOTE — ED Notes (Signed)
PD called for pt transportation to [facility].

## 2022-11-08 NOTE — ED Notes (Signed)
Pt requesting to make call to family member. Number obtained and pt at desk to call family member.

## 2022-11-08 NOTE — B[facility] Group Notes (Signed)
Adult Psychoeducational Group Note  Date:  11/08/2022 Time:  9:11 PM  Group Topic/Focus:  Wrap-Up Group:   The focus of this group is to help patients review their daily goal of treatment and discuss progress on daily workbooks.  Participation Level:  Did Not Attend  Zachary Hood 11/08/2022, 9:11 PM

## 2022-11-08 NOTE — ED Notes (Addendum)
Pt awake at his time.  This RN checked on pt and asked if he needed anything.  Pt denies current needs; endorses frustration about still being in the hospital as he states he was under the impression it would be a short visit.  Pt upset that he had to miss his daughter's birthday yesterday since he was brought to the hospital.  This RN offered therapeutic presence to pt.  Pt informed that breakfast would be around in about an hour.

## 2022-11-08 NOTE — Plan of Care (Signed)
  Problem: Education: Goal: Emotional status will improve Outcome: Progressing Goal: Mental status will improve Outcome: Progressing   Problem: Coping: Goal: Ability to verbalize frustrations and anger appropriately will improve Outcome: Progressing   

## 2022-11-08 NOTE — Progress Notes (Signed)
   11/08/22 2020  Psych Admission Type (Psych Patients Only)  Admission Status Involuntary  Psychosocial Assessment  Patient Complaints Irritability  Eye Contact Fair  Facial Expression Blank  Affect Appropriate to circumstance  Speech Logical/coherent  Interaction Cautious  Motor Activity Other (Comment) (WDL)  Appearance/Hygiene In scrubs  Behavior Characteristics Cooperative;Guarded  Mood Preoccupied  Thought Process  Coherency Circumstantial  Content Blaming others  Delusions None reported or observed  Perception WDL  Hallucination None reported or observed  Judgment Poor  Confusion None  Danger to Self  Current suicidal ideation? Denies  Agreement Not to Harm Self Yes  Description of Agreement Verbal  Danger to Others  Danger to Others None reported or observed  Danger to Others Abnormal  Harmful Behavior to others No threats or harm toward other people  Destructive Behavior No threats or harm toward property
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
## 2022-11-09 ENCOUNTER — Encounter (HOSPITAL_COMMUNITY): Payer: Self-pay

## 2022-11-09 DIAGNOSIS — F3160 Bipolar disorder, current episode mixed, unspecified: Secondary | ICD-10-CM | POA: Diagnosis not present

## 2022-11-09 DIAGNOSIS — Z87898 Personal history of other specified conditions: Secondary | ICD-10-CM

## 2022-11-09 DIAGNOSIS — Z9152 Personal history of nonsuicidal self-harm: Secondary | ICD-10-CM | POA: Insufficient documentation

## 2022-11-09 MED ORDER — NICOTINE 21 MG/24HR TD PT24
21.0000 mg | MEDICATED_PATCH | Freq: Every day | TRANSDERMAL | Status: DC
  Filled 2022-11-09 (×4): qty 1

## 2022-11-09 MED ORDER — HALOPERIDOL LACTATE 5 MG/ML IJ SOLN: 5.0000 mg | Freq: Four times a day (QID) | INTRAMUSCULAR | Status: DC | PRN

## 2022-11-09 MED ORDER — QUETIAPINE FUMARATE ER 50 MG PO TB24
150.0000 mg | ORAL_TABLET | Freq: Every day | ORAL | Status: DC
  Administered 2022-11-10 – 2022-11-11 (×2): 150 mg via ORAL
  Filled 2022-11-09 (×3): qty 3

## 2022-11-09 MED ORDER — HALOPERIDOL 5 MG PO TABS: 5.0000 mg | ORAL_TABLET | Freq: Four times a day (QID) | ORAL | Status: DC | PRN
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
## 2022-11-09 NOTE — Progress Notes (Signed)
   11/09/22 1610  15 Minute Checks  Location Bedroom  Visual Appearance Calm  Behavior Sleeping  Sleep (Behavioral Health Patients Only)  Calculate sleep? (Click Yes once per 24 hr at 0600 safety check) Yes  Documented sleep last 24 hours 9.75
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
## 2022-11-09 NOTE — Group Note (Signed)
Recreation Therapy Group Note   Group Topic:Stress Management  Group Date: 11/09/2022 Start Time: 1030 End Time: 1055 Facilitators: Piotr Christopher-McCall, LRT,CTRS Location: 500 [address] Dayroom   Goal Area(s) Addresses:  Patient will identify positive stress management techniques. Patient will identify benefits of using stress management post d/c.  Group Description: Meditation.  LRT and patients discussed the importance of stress management.  Patients also discussed how mental, physical and the spiritual elements work together to bring balance to the body.  LRT then played a morning meditation that helped to get individuals focused on their day and get them in the mental space to face whatever may come.    Affect/Mood: N/A   Participation Level: Did not attend    Clinical Observations/Individualized Feedback:     Plan: Continue to engage patient in RT group sessions 2-3x/week.   Blaine Hari-McCall, LRT,CTRS 11/09/2022 11:14 AM

## 2022-11-09 NOTE — BH IP Treatment Plan (Signed)
Interdisciplinary Treatment and Diagnostic Plan Update  11/09/2022 Time of Session: 11:30am Zachary Hood MRN: [id number]  Principal Diagnosis: Bipolar 1 disorder (HCC)  Secondary Diagnoses: Principal Problem:   Bipolar 1 disorder (HCC)   Current Medications:  Current Facility-Administered Medications  Medication Dose Route Frequency Provider Last Rate Last Admin   alum & mag hydroxide-simeth (MAALOX/MYLANTA) 200-200-20 MG/5ML suspension 30 mL  30 mL Oral Q4H PRN [name], NP       diphenhydrAMINE (BENADRYL) capsule 50 mg  50 mg Oral Q6H PRN [name], NP       Or   diphenhydrAMINE (BENADRYL) injection 50 mg  50 mg Intramuscular Q6H PRN [name], [name], NP       EPINEPHrine (EPI-PEN) injection 0.3 mg  0.3 mg Intramuscular Daily PRN [name], NP       gabapentin (NEURONTIN) capsule 200 mg  200 mg Oral TID [name], NP   200 mg at 11/08/22 1713   haloperidol (HALDOL) tablet 2 mg  2 mg Oral Q6H PRN [name], NP       Or   haloperidol lactate (HALDOL) injection 2 mg  2 mg Intramuscular Q6H PRN [name], [name], NP       LORazepam (ATIVAN) tablet 2 mg  2 mg Oral Q6H PRN [name], [name], NP       Or   LORazepam (ATIVAN) injection 2 mg  2 mg Intramuscular Q6H PRN [name], [name], NP       magnesium hydroxide (MILK OF MAGNESIA) suspension 30 mL  30 mL Oral Daily PRN [name], NP       mirtazapine (REMERON) tablet 15 mg  15 mg Oral QHS [name], NP   15 mg at 11/08/22 2020   QUEtiapine (SEROQUEL XR) 24 hr tablet 300 mg  300 mg Oral QHS [name], NP   300 mg at 11/08/22 2020   PTA Medications: Medications Prior to Admission  Medication Sig Dispense Refill Last Dose   buPROPion (WELLBUTRIN SR) 150 MG 12 hr tablet Take 150 mg by mouth every morning.      hydrOXYzine (ATARAX) 25 MG tablet Take 25 mg by mouth 3 (three) times daily as needed for anxiety (agitation). (Patient not taking: Reported on 04/26/2022)      lamoTRIgine (LAMICTAL) 25 MG  tablet Take 200 mg by mouth daily.      QUEtiapine (SEROQUEL) 25 MG tablet Take 25 mg by mouth 2 (two) times daily.       Patient Stressors: Marital or family conflict   Medication change or noncompliance    Patient Strengths: Active sense of humor  General fund of knowledge   Treatment Modalities: Medication Management, Group therapy, Case management,  1 to 1 session with clinician, Psychoeducation, Recreational therapy.   Physician Treatment Plan for Primary Diagnosis: Bipolar 1 disorder (HCC) Long Term Goal(s):     Short Term Goals:    Medication Management: Evaluate patient's response, side effects, and tolerance of medication regimen.  Therapeutic Interventions: 1 to 1 sessions, Unit Group sessions and Medication administration.  Evaluation of Outcomes: Progressing  Physician Treatment Plan for Secondary Diagnosis: Principal Problem:   Bipolar 1 disorder (HCC)  Long Term Goal(s):     Short Term Goals:       Medication Management: Evaluate patient's response, side effects, and tolerance of medication regimen.  Therapeutic Interventions: 1 to 1 sessions, Unit Group sessions and Medication administration.  Evaluation of Outcomes: Progressing   RN Treatment Plan for Primary Diagnosis: Bipolar 1 disorder (HCC) Long  Term Goal(s): Knowledge of disease and therapeutic regimen to maintain health will improve  Short Term Goals: Ability to remain free from injury will improve, Ability to verbalize frustration and anger appropriately will improve, Ability to demonstrate self-control, Ability to participate in decision making will improve, Ability to verbalize feelings will improve, Ability to disclose and discuss suicidal ideas, Ability to identify and develop effective coping behaviors will improve, and Compliance with prescribed medications will improve  Medication Management: RN will administer medications as ordered by provider, will assess and evaluate patient's response and  provide education to patient for prescribed medication. RN will report any adverse and/or side effects to prescribing provider.  Therapeutic Interventions: 1 on 1 counseling sessions, Psychoeducation, Medication administration, Evaluate responses to treatment, Monitor vital signs and CBGs as ordered, Perform/monitor CIWA, COWS, AIMS and Fall Risk screenings as ordered, Perform wound care treatments as ordered.  Evaluation of Outcomes: Progressing   LCSW Treatment Plan for Primary Diagnosis: Bipolar 1 disorder (HCC) Long Term Goal(s): Safe transition to appropriate next level of care at discharge, Engage patient in therapeutic group addressing interpersonal concerns.  Short Term Goals: Engage patient in aftercare planning with referrals and resources, Increase social support, Increase ability to appropriately verbalize feelings, Increase emotional regulation, Facilitate acceptance of mental health diagnosis and concerns, Facilitate patient progression through stages of change regarding substance use diagnoses and concerns, Identify triggers associated with mental health/substance abuse issues, and Increase skills for wellness and recovery  Therapeutic Interventions: Assess for all discharge needs, 1 to 1 time with Social worker, Explore available resources and support systems, Assess for adequacy in community support network, Educate family and significant other(s) on suicide prevention, Complete Psychosocial Assessment, Interpersonal group therapy.  Evaluation of Outcomes: Progressing   Progress in Treatment: Attending groups: No. Participating in groups: No. Taking medication as prescribed: Yes. Toleration medication: Yes. Family/Significant other contact made: No, will contact:  CSW will assess and identify social support  Patient understands diagnosis: No. Discussing patient identified problems/goals with staff: No. Medical problems stabilized or resolved: Yes. Denies suicidal/homicidal  ideation: Yes. Issues/concerns per patient self-inventory: No.  New problem(s) identified: No, Describe:  none reported   New Short Term/Long Term Goal(s):   medication stabilization, elimination of SI thoughts, development of comprehensive mental wellness plan.    Patient Goals: Pt states, "I want to be discharged"  Discharge Plan or Barriers: Patient recently admitted. CSW will continue to follow and assess for appropriate referrals and possible discharge planning.    Reason for Continuation of Hospitalization: Aggression Delusions  Medication stabilization Suicidal ideation Withdrawal symptoms  Estimated Length of Stay: 5-7 days  Last 3 [place] Suicide Severity Risk Score: Flowsheet Row Admission (Current) [facility] from 11/07/2022 in [facility] at [facility] ED from 04/26/2022 in [facility] at [facility]  C-SSRS RISK CATEGORY No Risk No Risk Error: Question 6 not populated       Last [facility] 2/9 Scores:     No data to display          Scribe for Treatment Team: Beatris Si, LCSW 11/09/2022 3:13 PM

## 2022-11-09 NOTE — Progress Notes (Signed)
Adult Psychoeducational Group Note  Date:  11/09/2022 Time:  8:48 PM  Group Topic/Focus:  Wrap-Up Group:   The focus of this group is to help patients review their daily goal of treatment and discuss progress on daily workbooks.  Participation Level:  Did Not Attend  Participation Quality:    Affect:    Cognitive:    Insight:   Engagement in Group:    Modes of Intervention:    Additional Comments:  Pt did not attend group.  Satira Anis 11/09/2022, 8:48 PM

## 2022-11-09 NOTE — Progress Notes (Signed)
   11/09/22 0950  Psych Admission Type (Psych Patients Only)  Admission Status Involuntary  Psychosocial Assessment  Patient Complaints None  Eye Contact Avoids  Facial Expression Blank  Affect Blunted  Speech UTA  Interaction Avoidant  Motor Activity Other (Comment) (unremarkable)  Appearance/Hygiene In scrubs  Behavior Characteristics Guarded  Mood Apathetic  Thought Process  Coherency Unable to assess  Content UTA  Delusions UTA  Perception UTA  Hallucination UTA  Judgment Poor  Confusion None  Danger to Self  Current suicidal ideation? Denies  Agreement Not to Harm Self Yes  Description of Agreement verbal  Danger to Others  Danger to Others None reported or observed  Danger to Others Abnormal  Harmful Behavior to others No threats or harm toward other people  Destructive Behavior No threats or harm toward property   Pt isolative to room and refused scheduled morning meds. Pt speaks little to none and uninterested in interaction.

## 2022-11-09 NOTE — Progress Notes (Signed)
   11/09/22 2045  Psych Admission Type (Psych Patients Only)  Admission Status Involuntary  Psychosocial Assessment  Patient Complaints None  Eye Contact Avoids  Facial Expression Flat  Affect Blunted  Speech Slow  Interaction Isolative;Avoidant  Motor Activity Slow  Appearance/Hygiene Disheveled  Behavior Characteristics Guarded  Mood Preoccupied  Aggressive Behavior  Effect No apparent injury  Thought Process  Coherency Circumstantial  Content Blaming others;Paranoia  Delusions None reported or observed  Perception WDL  Hallucination None reported or observed  Judgment Impaired  Confusion None  Danger to Self  Current suicidal ideation? Denies  Danger to Others  Danger to Others None reported or observed  Danger to Others Abnormal  Harmful Behavior to others No threats or harm toward other people  Destructive Behavior No threats or harm toward property

## 2022-11-10 DIAGNOSIS — F3160 Bipolar disorder, current episode mixed, unspecified: Secondary | ICD-10-CM | POA: Diagnosis not present

## 2022-11-10 LAB — LIPID PANEL
Cholesterol: 179 mg/dL (ref 0–200)
HDL: 50 mg/dL (ref >40)
LDL Cholesterol: 112 mg/dL — ABNORMAL HIGH (ref 0–99)
Total CHOL/HDL Ratio: 3.6 RATIO
Triglycerides: 83 mg/dL (ref <150)
VLDL: 17 mg/dL (ref 0–40)

## 2022-11-10 LAB — HEPATIC FUNCTION PANEL
ALT: 33 U/L (ref 0–44)
AST: 37 U/L (ref 15–41)
Albumin: 4.2 g/dL (ref 3.5–5.0)
Alkaline Phosphatase: 62 U/L (ref 38–126)
Bilirubin, Direct: 0.3 mg/dL — ABNORMAL HIGH (ref 0.0–0.2)
Indirect Bilirubin: 1.3 mg/dL — ABNORMAL HIGH (ref 0.3–0.9)
Total Bilirubin: 1.6 mg/dL — ABNORMAL HIGH (ref 0.3–1.2)
Total Protein: 7.6 g/dL (ref 6.5–8.1)

## 2022-11-10 LAB — TSH: TSH: 0.724 u[IU]/mL (ref 0.350–4.500)

## 2022-11-10 MED ORDER — GABAPENTIN 300 MG PO CAPS
300.0000 mg | ORAL_CAPSULE | Freq: Three times a day (TID) | ORAL | Status: DC
  Administered 2022-11-10 – 2022-11-11 (×2): 300 mg via ORAL
  Filled 2022-11-10 (×6): qty 1

## 2022-11-10 MED ORDER — QUETIAPINE FUMARATE ER 200 MG PO TB24
400.0000 mg | ORAL_TABLET | Freq: Every day | ORAL | Status: DC
  Administered 2022-11-10: 400 mg via ORAL
  Filled 2022-11-10 (×2): qty 1

## 2022-11-10 NOTE — Progress Notes (Signed)
   11/10/22 2000  Psych Admission Type (Psych Patients Only)  Admission Status Involuntary  Psychosocial Assessment  Patient Complaints Suspiciousness  Eye Contact Avoids  Facial Expression Flat  Affect Blunted  Speech Slow  Interaction Isolative;Avoidant  Motor Activity Slow  Appearance/Hygiene Disheveled  Behavior Characteristics Guarded  Mood Preoccupied  Aggressive Behavior  Effect No apparent injury  Thought Process  Coherency Circumstantial  Content Blaming others;Paranoia  Delusions None reported or observed  Perception WDL  Hallucination None reported or observed  Judgment Impaired  Confusion None  Danger to Self  Current suicidal ideation? Denies  Danger to Others  Danger to Others None reported or observed  Danger to Others Abnormal  Harmful Behavior to others No threats or harm toward other people  Destructive Behavior No threats or harm toward property
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
## 2022-11-10 NOTE — Group Note (Signed)
Recreation Therapy Group Note   Group Topic:Goal Setting  Group Date: 11/10/2022 Start Time: 1035 End Time: 1100 Facilitators: Makenah Karas-McCall, LRT,CTRS Location: [address] Dayroom   Goal Area(s) Addresses:  Patient will identify at least 3 long-term goal for their life.  Patient will reflect on short-term steps and support they will need to reach their goal(s).  Group Description:  Goal Setting. Patients were given a worksheet in which they were to identify their main priorities.  Patients were to then set goals to reach each priority they identified.  In addition, patients had to identified the action steps to reaching these goals.  Patients then shared 3 of their goals with the group.   Affect/Mood: N/A   Participation Level: Did not attend    Clinical Observations/Individualized Feedback:     Plan: Continue to engage patient in RT group sessions 2-3x/week.   Kristof Nadeem-McCall, LRT,CTRS 11/10/2022 12:31 PM
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
## 2022-11-10 NOTE — Progress Notes (Signed)
   11/10/22 1100  Psych Admission Type (Psych Patients Only)  Admission Status Involuntary  Psychosocial Assessment  Patient Complaints None  Eye Contact Brief  Facial Expression Flat  Affect Appropriate to circumstance;Blunted  Speech Logical/coherent;Argumentative  Interaction Isolative;Assertive;Dominating  Motor Activity Other (Comment) (WDL)  Appearance/Hygiene Disheveled  Behavior Characteristics Guarded  Mood Preoccupied  Thought Process  Coherency Circumstantial  Content Blaming others;Paranoia  Delusions None reported or observed  Perception WDL  Hallucination None reported or observed  Judgment Poor  Confusion None  Danger to Self  Current suicidal ideation? Denies  Agreement Not to Harm Self Yes  Description of Agreement verbal  Danger to Others  Danger to Others None reported or observed  Danger to Others Abnormal  Harmful Behavior to others No threats or harm toward other people  Destructive Behavior No threats or harm toward property

## 2022-11-10 NOTE — BHH Group Notes (Signed)
Patient did not attend the Wrap-up group. 

## 2022-11-11 DIAGNOSIS — F3160 Bipolar disorder, current episode mixed, unspecified: Secondary | ICD-10-CM

## 2022-11-11 LAB — HEMOGLOBIN A1C
Hgb A1c MFr Bld: 5.6 % (ref 4.8–5.6)
Mean Plasma Glucose: 114 mg/dL

## 2022-11-11 MED ORDER — MIRTAZAPINE 15 MG PO TABS: 15.0000 mg | ORAL_TABLET | Freq: Every day | ORAL | 0 refills | Status: DC

## 2022-11-11 MED ORDER — QUETIAPINE FUMARATE ER 400 MG PO TB24: 400.0000 mg | ORAL_TABLET | Freq: Every day | ORAL | 0 refills | Status: DC

## 2022-11-11 MED ORDER — GABAPENTIN 300 MG PO CAPS: 300.0000 mg | ORAL_CAPSULE | Freq: Three times a day (TID) | ORAL | 0 refills | Status: DC

## 2022-11-11 MED ORDER — NICOTINE 21 MG/24HR TD PT24: 21.0000 mg | MEDICATED_PATCH | Freq: Every day | TRANSDERMAL | 0 refills | Status: DC

## 2022-11-11 MED ORDER — EPINEPHRINE 0.3 MG/0.3ML IJ SOAJ: 0.3000 mg | Freq: Every day | INTRAMUSCULAR | 0 refills | Status: AC | PRN

## 2022-11-11 MED ORDER — QUETIAPINE FUMARATE ER 150 MG PO TB24: 150.0000 mg | ORAL_TABLET | Freq: Every day | ORAL | 0 refills | Status: DC

## 2022-11-11 NOTE — Transportation (Signed)
11/11/2022  NEVADA MAZEIKA DOB: February 16, 1986 MRN: [id number]   RIDER WAIVER AND RELEASE OF LIABILITY  For the purposes of helping with transportation needs, [facility] partners with outside transportation providers (taxi companies, [place], Catering manager.) to give [employer] patients or other approved people the choice of on-demand rides [employer]") to our buildings for non-emergency visits.  By using [employer], I, the person signing this document, on behalf of myself and/or any legal minors (in my care using the [employer]), agree:  Science writer given to me are supplied by independent, outside transportation providers who do not work for, or have any affiliation with, [employer]. [facility] is not a transportation company. [facility] has no control over the quality or safety of the rides I get using [employer]. [facility] has no control over whether any outside ride will happen on time or not. [facility] gives no guarantee on the reliability, quality, safety, or availability on any rides, or that no mistakes will happen. I know and accept that traveling by vehicle (car, truck, SVU, Zenaida Niece, bus, taxi, etc.) has risks of serious injuries such as disability, being paralyzed, and death. I know and agree the risk of using [employer] is mine alone, and not [employer]. Transport Services are provided "as is" and as are available. The transportation providers are in charge for all inspections and care of the vehicles used to provide these rides. I agree not to take legal action against [facility], its agents, employees, officers, directors, representatives, insurers, attorneys, assigns, successors, subsidiaries, and affiliates at any time for any reasons related directly or indirectly to using [employer]. I also agree not to take legal action against [facility] or its affiliates for any injury, death, or damage to property caused by or related to using  [employer]. I have read this Waiver and Release of Liability, and I understand the terms used in it and their legal meaning. This Waiver is freely and voluntarily given with the understanding that my right (or any legal minors) to legal action against [facility] relating to [employer] is knowingly given up to use these services.   I attest that I read the Ride Waiver and Release of Liability to [place], gave Mr. Schlossberg the opportunity to ask questions and answered the questions asked (if any). I affirm that Kayvan Q Samuelsen then provided consent for assistance with transportation.
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
## 2022-11-11 NOTE — Discharge Instructions (Signed)
-  Follow-up with your outpatient psychiatric provider -instructions on appointment date, time, and address (location) are provided to you in discharge paperwork.  -Take your psychiatric medications as prescribed at discharge - instructions are provided to you in the discharge paperwork  -Follow-up with outpatient primary care doctor and other specialists -for management of preventative medicine and any chronic medical disease.  -Recommend abstinence from alcohol, tobacco, and other illicit drug use at discharge.   -If your psychiatric symptoms recur, worsen, or if you have side effects to your psychiatric medications, call your outpatient psychiatric provider, 911, [phone] or go to the nearest emergency department.  -If suicidal thoughts occur, call your outpatient psychiatric provider, 911, [phone] or go to the nearest emergency department.  Naloxone (Narcan) can help reverse an overdose when given to the victim quickly.  [place] offers free naloxone kits and instructions/training on its use.  Add naloxone to your first aid kit and you can help save a life.   Pick up your free kit at the following locations:   [place]:  [facility], [address] [place] [address] ([phone]) [facility] [address] [place] [id number] ([phone]) [facility] [address] [place] [address]  [place]: [facility] [address] [place] [address] ([phone]) [facility] [address] [place] [address] ([phone])  

## 2022-11-11 NOTE — Progress Notes (Signed)
Pt discharged to lobby. Pt was stable and appreciative at that time. All papers, samples and prescriptions were given and valuables returned. Verbal understanding expressed. Denies SI/HI and A/VH. Pt given opportunity to express concerns and ask questions.  

## 2022-11-11 NOTE — [facility] Suicide Risk Assessment (Signed)
[facility] INPATIENT:  Family/Significant Other Suicide Prevention Education  Suicide Prevention Education:  Education Completed; Arlys John, friend, [phone]  (name of family member/significant other) has been identified by the patient as the family member/significant other with whom the patient will be residing, and identified as the person(s) who will aid the patient in the event of a mental health crisis (suicidal ideations/suicide attempt).  With written consent from the patient, the family member/significant other has been provided the following suicide prevention education, prior to the and/or following the discharge of the patient.  CSW spoke with friend and friend confirmed that he will be staying with him.  No safety concerns and no guns weapons.  Friend aware of discharge at 11:30am and willing to support patient with ongoing mental health follow up.  The suicide prevention education provided includes the following: Suicide risk factors Suicide prevention and interventions National Suicide Hotline telephone number [facility] assessment telephone number [facility] Emergency Assistance 911 [facility] and/or Residential Mobile Crisis Unit telephone number  Request made of family/significant other to: Remove weapons (e.g., guns, rifles, knives), all items previously/currently identified as safety concern.   Remove drugs/medications (over-the-counter, prescriptions, illicit drugs), all items previously/currently identified as a safety concern.  The family member/significant other verbalizes understanding of the suicide prevention education information provided.  The family member/significant other agrees to remove the items of safety concern listed above.  Arleta Ostrum E Malinda Mayden 11/11/2022, 10:05 AM

## 2022-11-11 NOTE — Progress Notes (Signed)
  [facility] Adult Case Management Discharge Plan :  Will you be returning to the same living situation after discharge:  No. Patient will be going to a friends, Arlys John At discharge, do you have transportation home?: Yes,  taxi from hospital Do you have the ability to pay for your medications: Yes,  Insurance   Release of information consent forms completed and in the chart;  Patient's signature needed at discharge.  Patient to Follow up at:  Follow-up Information     [facility] Follow up.   Why: You have a follow up appointment with this provider via the phone on June 13th at 8:30am. Contact information: [address]  [address] [place] [address] [phone]                 Next level of care provider has access to [facility] Link:no  Safety Planning and Suicide Prevention discussed: Yes,  friend, Arlys John     Has patient been referred to the Quitline?: Patient refused referral for treatment  Patient has been referred for addiction treatment: No known substance use disorder. Pt currently doesn't use drugs  Acadia Thammavong E Mamye Bolds, LCSW 11/11/2022, 10:21 AM

## 2022-11-11 NOTE — [facility] Counselor (Signed)
[facility]/BMU LCSW Progress Note   11/11/2022    10:37 AM  Refujio SUMMER HAENEL   [phone]   Type of Contact and Topic:  Vocational Rehab  CSW provided resources to assist with finding a job and preparing for a job. Patient agreeable to follow up after discharge.     Signed:  Anson [place] MSW, LCSW, LCAS 11/11/2022 10:37 AM

## 2023-02-04 ENCOUNTER — Other Ambulatory Visit: Payer: Self-pay

## 2023-02-04 ENCOUNTER — Emergency Department (HOSPITAL_COMMUNITY)
Admission: EM | Admit: 2023-02-04 | Discharge: 2023-02-04 | Disposition: A | Discharge status: Home / Self Care | Payer: MEDICAID | Attending: Emergency Medicine | Admitting: Emergency Medicine

## 2023-02-04 ENCOUNTER — Encounter (HOSPITAL_COMMUNITY): Payer: Self-pay

## 2023-02-04 DIAGNOSIS — Z91010 Allergy to peanuts: Secondary | ICD-10-CM | POA: Diagnosis not present

## 2023-02-04 DIAGNOSIS — J45909 Unspecified asthma, uncomplicated: Secondary | ICD-10-CM | POA: Insufficient documentation

## 2023-02-04 DIAGNOSIS — U071 COVID-19: Secondary | ICD-10-CM | POA: Diagnosis not present

## 2023-02-04 DIAGNOSIS — R059 Cough, unspecified: Secondary | ICD-10-CM | POA: Diagnosis present

## 2023-02-04 LAB — SARS CORONAVIRUS 2 BY RT PCR: SARS Coronavirus 2 by RT PCR: POSITIVE — AB

## 2023-02-04 MED ORDER — ALBUTEROL SULFATE HFA 108 (90 BASE) MCG/ACT IN AERS
2.0000 | INHALATION_SPRAY | Freq: Once | RESPIRATORY_TRACT | Status: AC
  Administered 2023-02-04: 2 via RESPIRATORY_TRACT
  Filled 2023-02-04: qty 6.7

## 2023-02-04 NOTE — ED Provider Notes (Signed)
[place] EMERGENCY DEPARTMENT AT [facility] Provider Note   CSN: [id number] Arrival date & time: 02/04/23  1939     History  Chief Complaint  Patient presents with   Possible Covid    Zachary Hood is a 37 y.o. male.  The history is provided by the patient.  Cough Severity:  Moderate Timing:  Intermittent Progression:  Worsening Chronicity:  New Smoker: yes   Worsened by:  Nothing Associated symptoms: chills, diaphoresis, myalgias and shortness of breath    Patient with history of asthma presents with COVID symptoms.  Patient reports recent exposure.  Over the past 24 hours she has had cough, body aches, chills and diaphoresis.  He has had posttussive emesis.   No hemoptysis.  He reports mild shortness of breath Past Medical History:  Diagnosis Date   Anemia    Anxiety    Asthma    Bipolar 1 disorder (HCC)    Depression    PTSD (post-traumatic stress disorder)     Home Medications Prior to Admission medications   Medication Sig Start Date End Date Taking? Authorizing Provider  gabapentin (NEURONTIN) 300 MG capsule Take 1 capsule (300 mg total) by mouth 3 (three) times daily. 11/11/22 12/11/22  Massengill, Harrold Donath, MD  mirtazapine (REMERON) 15 MG tablet Take 1 tablet (15 mg total) by mouth at bedtime. 11/11/22 12/11/22  Massengill, Harrold Donath, MD  nicotine (NICODERM CQ - DOSED IN MG/24 HOURS) 21 mg/24hr patch Place 1 patch (21 mg total) onto the skin daily. 11/12/22   Massengill, Harrold Donath, MD  QUEtiapine (SEROQUEL XR) 150 MG 24 hr tablet Take 1 tablet (150 mg total) by mouth daily. 11/12/22 12/12/22  Massengill, Harrold Donath, MD  QUEtiapine (SEROQUEL XR) 400 MG 24 hr tablet Take 1 tablet (400 mg total) by mouth at bedtime. 11/11/22 12/11/22  Massengill, Harrold Donath, MD      Allergies    Egg-derived products, Fish allergy, Ibuprofen, Peanut butter flavor, Tylenol [acetaminophen], and Penicillins    Review of Systems   Review of Systems  Constitutional:  Positive for chills and  diaphoresis.  Respiratory:  Positive for cough and shortness of breath.   Musculoskeletal:  Positive for myalgias.    Physical Exam Updated Vital Signs BP (!) 142/95   Pulse 68   Temp 99.4 F (37.4 C) (Oral)   Resp 18   SpO2 100%  Physical Exam CONSTITUTIONAL: Well developed/well nourished HEAD: Normocephalic/atraumatic EYES: EOMI/PERRL ENMT: Mucous membranes moist NECK: supple no meningeal signs CV: S1/S2 noted, no murmurs/rubs/gallops noted LUNGS: Lungs are clear to auscultation bilaterally, no apparent distress ABDOMEN: soft, mild tenderness, no hernia noted, well-healed surgical scars noted NEURO: Pt is awake/alert/appropriate, moves all extremitiesx4.  No facial droop.   EXTREMITIES: pulses normal/equal, full ROM SKIN: warm, color normal PSYCH: no abnormalities of mood noted, alert and oriented to situation  ED Results / Procedures / Treatments   Labs (all labs ordered are listed, but only abnormal results are displayed) Labs Reviewed  SARS CORONAVIRUS 2 BY RT PCR - Abnormal; Notable for the following components:      Result Value   SARS Coronavirus 2 by RT PCR POSITIVE (*)    All other components within normal limits    EKG None  Radiology No results found.  Procedures Procedures    Medications Ordered in ED Medications  albuterol (VENTOLIN HFA) 108 (90 Base) MCG/ACT inhaler 2 puff (2 puffs Inhalation Given 02/04/23 2330)    ED Course/ Medical Decision Making/ A&P  Medical Decision Making Risk Prescription drug management.   Patient found to have COVID-19.  He is otherwise well-appearing. There is no hypoxia or tachycardia.  Overall lung sounds are clear, but he is requesting albuterol No indication for oral antivirals. No indication for further workup.  Patient is safe for outpatient management        Final Clinical Impression(s) / ED Diagnoses Final diagnoses:  COVID-19    Rx / DC Orders ED Discharge  Orders     None         Zadie Rhine, MD 02/04/23 2352

## 2023-02-04 NOTE — ED Notes (Signed)
Pt instructed to take off his winter coat and hoodie so that his fever would come down.

## 2023-02-04 NOTE — ED Triage Notes (Signed)
Pt to ED by POV from home c/o Covid symptoms getting progressively worse for the past week. Pt states another family member has had covid at his house also. Arrives A+O, VSS, NADN.

## 2023-02-04 NOTE — ED Notes (Signed)
Patient verbalizes understanding of discharge instructions. Opportunity for questioning and answers were provided. Armband removed by staff, pt discharged from ED. Pt ambulatory to ED waiting room with steady gait.  

## 2023-04-04 ENCOUNTER — Encounter: Payer: Self-pay | Admitting: Emergency Medicine

## 2023-04-04 ENCOUNTER — Other Ambulatory Visit: Payer: Self-pay

## 2023-04-04 ENCOUNTER — Ambulatory Visit
Admission: EM | Admit: 2023-04-04 | Discharge: 2023-04-04 | Disposition: A | Discharge status: Home / Self Care | Payer: MEDICAID | Attending: Physician Assistant | Admitting: Physician Assistant

## 2023-04-04 DIAGNOSIS — S61512A Laceration without foreign body of left wrist, initial encounter: Secondary | ICD-10-CM

## 2023-04-04 MED ORDER — DOXYCYCLINE HYCLATE 100 MG PO CAPS: 100.0000 mg | ORAL_CAPSULE | Freq: Two times a day (BID) | ORAL | 0 refills | Status: DC
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
## 2023-09-12 ENCOUNTER — Encounter (HOSPITAL_COMMUNITY): Payer: Self-pay

## 2023-09-12 ENCOUNTER — Emergency Department (HOSPITAL_COMMUNITY)
Admission: EM | Admit: 2023-09-12 | Discharge: 2023-09-12 | Discharge status: Against Medical Advice | Payer: MEDICAID | Attending: Emergency Medicine | Admitting: Emergency Medicine

## 2023-09-12 ENCOUNTER — Emergency Department (HOSPITAL_BASED_OUTPATIENT_CLINIC_OR_DEPARTMENT_OTHER): Payer: MEDICAID

## 2023-09-12 ENCOUNTER — Encounter (HOSPITAL_BASED_OUTPATIENT_CLINIC_OR_DEPARTMENT_OTHER): Payer: Self-pay | Admitting: Emergency Medicine

## 2023-09-12 ENCOUNTER — Emergency Department (HOSPITAL_BASED_OUTPATIENT_CLINIC_OR_DEPARTMENT_OTHER)
Admission: EM | Admit: 2023-09-12 | Discharge: 2023-09-13 | Disposition: A | Discharge status: Home / Self Care | Payer: MEDICAID | Source: Home / Self Care | Attending: Emergency Medicine | Admitting: Emergency Medicine

## 2023-09-12 ENCOUNTER — Other Ambulatory Visit: Payer: Self-pay

## 2023-09-12 DIAGNOSIS — Z5321 Procedure and treatment not carried out due to patient leaving prior to being seen by health care provider: Secondary | ICD-10-CM | POA: Insufficient documentation

## 2023-09-12 DIAGNOSIS — K56609 Unspecified intestinal obstruction, unspecified as to partial versus complete obstruction: Secondary | ICD-10-CM | POA: Diagnosis present

## 2023-09-12 DIAGNOSIS — R3589 Other polyuria: Secondary | ICD-10-CM | POA: Diagnosis not present

## 2023-09-12 DIAGNOSIS — K561 Intussusception: Secondary | ICD-10-CM | POA: Diagnosis not present

## 2023-09-12 DIAGNOSIS — R109 Unspecified abdominal pain: Secondary | ICD-10-CM

## 2023-09-12 DIAGNOSIS — R112 Nausea with vomiting, unspecified: Secondary | ICD-10-CM | POA: Diagnosis not present

## 2023-09-12 DIAGNOSIS — R197 Diarrhea, unspecified: Secondary | ICD-10-CM | POA: Insufficient documentation

## 2023-09-12 LAB — URINALYSIS, ROUTINE W REFLEX MICROSCOPIC
Bilirubin Urine: NEGATIVE
Glucose, UA: NEGATIVE mg/dL
Hgb urine dipstick: NEGATIVE
Ketones, ur: NEGATIVE mg/dL
Leukocytes,Ua: NEGATIVE
Nitrite: NEGATIVE
Protein, ur: NEGATIVE mg/dL
Specific Gravity, Urine: 1.02 (ref 1.005–1.030)
pH: 8 (ref 5.0–8.0)

## 2023-09-12 LAB — CBC
HCT: 44.2 % (ref 39.0–52.0)
Hemoglobin: 14.5 g/dL (ref 13.0–17.0)
MCH: 24.5 pg — ABNORMAL LOW (ref 26.0–34.0)
MCHC: 32.8 g/dL (ref 30.0–36.0)
MCV: 74.8 fL — ABNORMAL LOW (ref 80.0–100.0)
Platelets: 253 10*3/uL (ref 150–400)
RBC: 5.91 MIL/uL — ABNORMAL HIGH (ref 4.22–5.81)
RDW: 15 % (ref 11.5–15.5)
WBC: 8.9 10*3/uL (ref 4.0–10.5)
nRBC: 0 % (ref 0.0–0.2)

## 2023-09-12 LAB — COMPREHENSIVE METABOLIC PANEL WITH GFR
ALT: 31 U/L (ref 0–44)
AST: 44 U/L — ABNORMAL HIGH (ref 15–41)
Albumin: 4.1 g/dL (ref 3.5–5.0)
Alkaline Phosphatase: 65 U/L (ref 38–126)
Anion gap: 8 (ref 5–15)
BUN: 8 mg/dL (ref 6–20)
CO2: 26 mmol/L (ref 22–32)
Calcium: 9.4 mg/dL (ref 8.9–10.3)
Chloride: 109 mmol/L (ref 98–111)
Creatinine, Ser: 1.15 mg/dL (ref 0.61–1.24)
GFR, Estimated: >60 mL/min (ref >60)
Glucose, Bld: 93 mg/dL (ref 70–99)
Potassium: 3.6 mmol/L (ref 3.5–5.1)
Sodium: 143 mmol/L (ref 135–145)
Total Bilirubin: 1.2 mg/dL (ref 0.0–1.2)
Total Protein: 7.4 g/dL (ref 6.5–8.1)

## 2023-09-12 LAB — LIPASE, BLOOD: Lipase: 27 U/L (ref 11–51)

## 2023-09-12 MED ORDER — IOHEXOL 300 MG/ML  SOLN
100.0000 mL | Freq: Once | INTRAMUSCULAR | Status: AC | PRN
  Administered 2023-09-12: 100 mL via INTRAVENOUS

## 2023-09-12 MED ORDER — ONDANSETRON HCL 4 MG/2ML IJ SOLN
4.0000 mg | Freq: Once | INTRAMUSCULAR | Status: AC
  Administered 2023-09-12: 4 mg via INTRAVENOUS
  Filled 2023-09-12: qty 2

## 2023-09-12 MED ORDER — HYDROMORPHONE HCL 1 MG/ML IJ SOLN
1.0000 mg | Freq: Once | INTRAMUSCULAR | Status: AC
  Administered 2023-09-12: 1 mg via INTRAVENOUS
  Filled 2023-09-12: qty 1

## 2023-09-12 NOTE — ED Triage Notes (Signed)
 Left side flank Abdo pain, hurts when moving  Reports one leg is small then the other.  Feels like he has lost weight   Been going on for over a month  Seen at [facility] labs done left before being seen
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
## 2023-09-12 NOTE — ED Triage Notes (Signed)
 Pt has abd pain on left side that started today. C/O n/v/d. Pt states sudden weight loss.C/O polyuria, sever thrist, and odor to urine. Denies fevers.

## 2023-09-12 NOTE — ED Notes (Signed)
 Pt stated he is leaving to go to another ED.

## 2023-09-13 ENCOUNTER — Emergency Department (HOSPITAL_BASED_OUTPATIENT_CLINIC_OR_DEPARTMENT_OTHER): Payer: MEDICAID

## 2023-09-13 ENCOUNTER — Encounter (HOSPITAL_BASED_OUTPATIENT_CLINIC_OR_DEPARTMENT_OTHER): Payer: Self-pay | Admitting: Internal Medicine

## 2023-09-13 DIAGNOSIS — K56609 Unspecified intestinal obstruction, unspecified as to partial versus complete obstruction: Secondary | ICD-10-CM | POA: Diagnosis present

## 2023-09-13 MED ORDER — ONDANSETRON HCL 4 MG PO TABS: 4.0000 mg | ORAL_TABLET | Freq: Four times a day (QID) | ORAL | 0 refills | Status: DC

## 2023-09-13 MED ORDER — HYDROMORPHONE HCL 1 MG/ML IJ SOLN
1.0000 mg | Freq: Once | INTRAMUSCULAR | Status: AC
  Administered 2023-09-13: 1 mg via INTRAVENOUS
  Filled 2023-09-13: qty 1

## 2023-09-13 MED ORDER — OXYCODONE HCL 5 MG PO TABS: 5.0000 mg | ORAL_TABLET | Freq: Four times a day (QID) | ORAL | 0 refills | Status: DC | PRN

## 2023-09-13 MED ORDER — ONDANSETRON HCL 4 MG/2ML IJ SOLN
4.0000 mg | Freq: Once | INTRAMUSCULAR | Status: AC
  Administered 2023-09-13: 4 mg via INTRAVENOUS
  Filled 2023-09-13: qty 2

## 2023-09-13 MED ORDER — IOHEXOL 300 MG/ML  SOLN
100.0000 mL | Freq: Once | INTRAMUSCULAR | Status: AC | PRN
  Administered 2023-09-13: 80 mL via INTRAVENOUS

## 2023-09-13 NOTE — Discharge Instructions (Addendum)
 You were seen in the emergency department for your abdominal pain.  Your initial CT scan showed concern for an early blockage with possible telescoping of your bowels.  We repeated your CT scan this morning and it has since resolved.  Some of your pain may be related to all the scar tissue in your abdomen.  I have given you a few oxycodone that you can take as needed for pain and Zofran as needed for nausea.  Oxycodone can make you drowsy so do not take before driving, working or operating heavy machinery.  You can follow-up with your primary doctor or with general surgery to have your symptoms rechecked and for further pain management.  You should start with a clear liquid diet today and will slowly advance back to a normal diet as you can tolerate.  You should return to the emergency department if you have significantly worsening pain, repetitive vomiting despite the nausea medicine or any other new or concerning symptoms.
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
## 2023-09-13 NOTE — ED Notes (Signed)
 Called Karen at [facility] to cancel truck and she will update bed placement. 10:49

## 2023-09-13 NOTE — ED Notes (Signed)
 Pt refused to wear cardiac and bp monitoring.
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
## 2023-09-14 ENCOUNTER — Ambulatory Visit (INDEPENDENT_AMBULATORY_CARE_PROVIDER_SITE_OTHER): Payer: MEDICAID | Admitting: Nurse Practitioner

## 2023-09-14 ENCOUNTER — Encounter: Payer: Self-pay | Admitting: Nurse Practitioner

## 2023-09-14 VITALS — BP 121/88 | HR 75 | Temp 97.9°F | Wt 158.0 lb

## 2023-09-14 DIAGNOSIS — F4310 Post-traumatic stress disorder, unspecified: Secondary | ICD-10-CM | POA: Diagnosis not present

## 2023-09-14 DIAGNOSIS — F32A Depression, unspecified: Secondary | ICD-10-CM | POA: Diagnosis not present

## 2023-09-14 DIAGNOSIS — K561 Intussusception: Secondary | ICD-10-CM | POA: Diagnosis not present

## 2023-09-14 DIAGNOSIS — F419 Anxiety disorder, unspecified: Secondary | ICD-10-CM

## 2023-09-14 MED ORDER — ONDANSETRON HCL 4 MG PO TABS: 4.0000 mg | ORAL_TABLET | Freq: Four times a day (QID) | ORAL | 0 refills | Status: DC

## 2023-09-14 MED ORDER — OXYCODONE HCL 15 MG PO TABS: 15.0000 mg | ORAL_TABLET | Freq: Four times a day (QID) | ORAL | 0 refills | Status: AC | PRN

## 2023-09-14 MED ORDER — OXYCODONE HCL 5 MG PO TABS: 5.0000 mg | ORAL_TABLET | Freq: Four times a day (QID) | ORAL | 0 refills | Status: DC | PRN

## 2023-09-14 NOTE — Progress Notes (Signed)
 Subjective   Patient ID: Zachary Hood, male    DOB: 06-13-1986, 38 y.o.   MRN: [id number]  Chief Complaint  Patient presents with   Establish Care    Referring provider: [facility]  Zachary Hood is a 38 y.o. male with Past Medical History: No date: Anemia No date: Anxiety No date: Asthma No date: Bipolar 1 disorder (HCC) No date: Depression No date: PTSD (post-traumatic stress disorder)   HPI  Patient presents today to establish care.  He was seen in the ED on 09/12/2018 follow-up and was diagnosed with intussusception/small bowel obstruction.  He was advised to follow-up with [place] surgery.  Will place referral today.  We will also place referral to GI.  Patient is still having left lower quadrant pain.  We will refill pain medicine and Zofran until he can be seen.  Patient is also requesting a referral to a different psychiatrist.  He does need counseling and medication management.  Denies f/c/s, n/v/d, hemoptysis, PND, leg swelling. Denies chest pain or edema.     Allergies  Allergen Reactions   Egg-Derived Products Anaphylaxis   Fish Allergy Anaphylaxis   Ibuprofen Anaphylaxis   Peanut Butter Flavoring Agent (Non-Screening) Anaphylaxis   Tylenol [Acetaminophen] Anaphylaxis   Penicillins Other (See Comments)    Pt remembers adverse reaction with last admin    Immunization History  Administered Date(s) Administered   Tdap 08/08/2015, 04/15/2020, 09/04/2020, 10/27/2021    Tobacco History: Social History   Tobacco Use  Smoking Status Some Days  Smokeless Tobacco Never   Ready to quit: Yes Counseling given: Yes   Outpatient Encounter Medications as of 09/14/2023  Medication Sig   gabapentin (NEURONTIN) 300 MG capsule Take 1 capsule (300 mg total) by mouth 3 (three) times daily.   mirtazapine (REMERON) 15 MG tablet Take 1 tablet (15 mg total) by mouth at bedtime.   oxyCODONE (ROXICODONE) 15 MG immediate release tablet Take 1 tablet  (15 mg total) by mouth every 6 (six) hours as needed for up to 7 days for pain.   QUEtiapine (SEROQUEL XR) 150 MG 24 hr tablet Take 1 tablet (150 mg total) by mouth daily.   [DISCONTINUED] ondansetron (ZOFRAN) 4 MG tablet Take 1 tablet (4 mg total) by mouth every 6 (six) hours.   [DISCONTINUED] oxyCODONE (ROXICODONE) 5 MG immediate release tablet Take 1 tablet (5 mg total) by mouth every 6 (six) hours as needed.   doxycycline (VIBRAMYCIN) 100 MG capsule Take 1 capsule (100 mg total) by mouth 2 (two) times daily. (Patient not taking: Reported on 09/14/2023)   nicotine (NICODERM CQ - DOSED IN MG/24 HOURS) 21 mg/24hr patch Place 1 patch (21 mg total) onto the skin daily. (Patient not taking: Reported on 09/14/2023)   ondansetron (ZOFRAN) 4 MG tablet Take 1 tablet (4 mg total) by mouth every 6 (six) hours.   [DISCONTINUED] oxyCODONE (ROXICODONE) 5 MG immediate release tablet Take 1 tablet (5 mg total) by mouth every 6 (six) hours as needed for up to 7 days.   [DISCONTINUED] QUEtiapine (SEROQUEL XR) 400 MG 24 hr tablet Take 1 tablet (400 mg total) by mouth at bedtime.   No facility-administered encounter medications on file as of 09/14/2023.    Review of Systems  Review of Systems  Constitutional: Negative.   HENT: Negative.    Cardiovascular: Negative.   Gastrointestinal: Negative.   Allergic/Immunologic: Negative.   Neurological: Negative.   Psychiatric/Behavioral: Negative.       Objective:   BP  121/88   Pulse 75   Temp 97.9 F (36.6 C) (Oral)   Wt 158 lb (71.7 kg)   SpO2 100%   BMI 24.75 kg/m   Wt Readings from Last 5 Encounters:  09/14/23 158 lb (71.7 kg)  09/12/23 130 lb (59 kg)  11/07/22 155 lb (70.3 kg)  04/26/22 155 lb (70.3 kg)  02/09/22 160 lb 0.9 oz (72.6 kg)     Physical Exam Vitals and nursing note reviewed.  Constitutional:      General: He is not in acute distress.    Appearance: He is well-developed.  Cardiovascular:     Rate and Rhythm: Normal rate and  regular rhythm.  Pulmonary:     Effort: Pulmonary effort is normal.     Breath sounds: Normal breath sounds.  Skin:    General: Skin is warm and dry.  Neurological:     Mental Status: He is alert and oriented to person, place, and time.       Assessment & Plan:   Intussusception intestine [facility]) -     Ambulatory referral to General Surgery -     Ambulatory referral to Gastroenterology -     oxyCODONE HCl; Take 1 tablet (15 mg total) by mouth every 6 (six) hours as needed for up to 7 days for pain.  Dispense: 28 tablet; Refill: 0  PTSD (post-traumatic stress disorder) -     Ambulatory referral to Psychiatry  Depression, unspecified depression type -     Ambulatory referral to Psychiatry  Anxiety -     Ambulatory referral to Psychiatry  Other orders -     Ondansetron HCl; Take 1 tablet (4 mg total) by mouth every 6 (six) hours.  Dispense: 12 tablet; Refill: 0     Return in about 4 weeks (around 10/12/2023).   Ivonne Andrew, NP 09/14/2023

## 2023-09-14 NOTE — Patient Instructions (Signed)
 1. Intussusception intestine (HCC) (Primary)  - Ambulatory referral to General Surgery - Ambulatory referral to Gastroenterology - oxyCODONE (ROXICODONE) 15 MG immediate release tablet; Take 1 tablet (15 mg total) by mouth every 6 (six) hours as needed for up to 7 days for pain.  Dispense: 28 tablet; Refill: 0  2. PTSD (post-traumatic stress disorder)  - Ambulatory referral to Psychiatry  3. Depression, unspecified depression type  - Ambulatory referral to Psychiatry  4. Anxiety  - Ambulatory referral to Psychiatry

## 2023-10-17 ENCOUNTER — Ambulatory Visit: Payer: Self-pay

## 2023-10-17 ENCOUNTER — Ambulatory Visit: Payer: Self-pay | Admitting: Nurse Practitioner

## 2023-10-17 NOTE — Telephone Encounter (Signed)
  Chief Complaint: abdominal pain Symptoms: RUQ abdominal pain radiates to LUQ and sternum, vomiting, abdomen appears swollen on right side Frequency: x couple of weeks Pertinent Negatives: Patient denies fever Disposition: [x] ED /[] Urgent Care (no appt availability in office) / [] Appointment(In office/virtual)/ []  [facility]/ [] Home Care/ [] Refused Recommended Disposition /[] [facility]/ []  Follow-up with PCP Additional Notes: Patient went to ED a month ago and told he has a bowel obstruction and was referred to GI. His partner states she has not heard from GI regarding follow up. He states he was put on pain meds and sent home. Patient cancelled/Noshow'd for his new patient appointment [facility] with [facility]. Advised ED and patient is agreeable.  Copied from CRM [id number]. Topic: Clinical - Red Word Triage >> Oct 17, 2023  2:49 PM [place] B wrote: [facility] that prompted transfer to Nurse Triage: pt has severe pain in ribs and stomach Reason for Disposition  [1] SEVERE pain (e.g., excruciating) AND [2] present > 1 hour  Answer Assessment - Initial Assessment Questions 1. LOCATION: "Where does it hurt?"      Right upper abdominal and right ribs.  2. RADIATION: "Does the pain shoot anywhere else?" (e.g., chest, back)     Shoots across to the left and up to his sternum.  3. ONSET: "When did the pain begin?" (Minutes, hours or days ago)      X couple weeks.  4. SUDDEN: "Gradual or sudden onset?"     Gradual.  5. PATTERN "Does the pain come and go, or is it constant?"    - If it comes and goes: "How long does it last?" "Do you have pain now?"     (Note: Comes and goes means the pain is intermittent. It goes away completely between bouts.)    - If constant: "Is it getting better, staying the same, or getting worse?"      (Note: Constant means the pain never goes away completely; most serious pain is constant and gets worse.)      Constant. 6. SEVERITY: "How  bad is the pain?"  (e.g., Scale 1-10; mild, moderate, or severe)    - MILD (1-3): Doesn't interfere with normal activities, abdomen soft and not tender to touch.     - MODERATE (4-7): Interferes with normal activities or awakens from sleep, abdomen tender to touch.     - SEVERE (8-10): Excruciating pain, doubled over, unable to do any normal activities.       10/10  7. RECURRENT SYMPTOM: "Have you ever had this type of stomach pain before?" If Yes, ask: "When was the last time?" and "What happened that time?"      Denies. 8. CAUSE: "What do you think is causing the stomach pain?"     Bowel obstruction. 9. RELIEVING/AGGRAVATING FACTORS: "What makes it better or worse?" (e.g., antacids, bending or twisting motion, bowel movement)     Worse when straining to use the bathroom 10. OTHER SYMPTOMS: "Do you have any other symptoms?" (e.g., back pain, diarrhea, fever, urination pain, vomiting)       Swelling to right side of abdomen, vomiting (coming out of his nose, resolved), intermittent nausea, sweating at night, constipation and diarrhea  Protocols used: Abdominal Pain - Male-A-AH

## 2023-11-03 ENCOUNTER — Ambulatory Visit: Payer: Self-pay | Admitting: Internal Medicine
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
## 2023-11-07 ENCOUNTER — Ambulatory Visit: Payer: Self-pay | Admitting: Nurse Practitioner

## 2023-11-08 ENCOUNTER — Encounter: Payer: Self-pay | Admitting: Nurse Practitioner

## 2023-11-08 ENCOUNTER — Ambulatory Visit (INDEPENDENT_AMBULATORY_CARE_PROVIDER_SITE_OTHER): Payer: MEDICAID | Admitting: Nurse Practitioner

## 2023-11-08 VITALS — BP 128/86 | HR 76 | Temp 98.1°F | Wt 169.0 lb

## 2023-11-08 DIAGNOSIS — K5900 Constipation, unspecified: Secondary | ICD-10-CM | POA: Diagnosis not present

## 2023-11-08 DIAGNOSIS — F4310 Post-traumatic stress disorder, unspecified: Secondary | ICD-10-CM

## 2023-11-08 DIAGNOSIS — F32A Depression, unspecified: Secondary | ICD-10-CM

## 2023-11-08 DIAGNOSIS — F419 Anxiety disorder, unspecified: Secondary | ICD-10-CM

## 2023-11-08 DIAGNOSIS — K561 Intussusception: Secondary | ICD-10-CM | POA: Diagnosis not present

## 2023-11-08 MED ORDER — POLYETHYLENE GLYCOL 3350 17 G PO PACK: 17.0000 g | PACK | Freq: Every day | ORAL | 0 refills | Status: AC

## 2023-11-08 MED ORDER — OXYCODONE HCL 15 MG PO TABS: 15.0000 mg | ORAL_TABLET | Freq: Three times a day (TID) | ORAL | 0 refills | Status: AC | PRN

## 2023-11-08 NOTE — Progress Notes (Signed)
 Subjective   Patient ID: Zachary Hood, male    DOB: 08/12/1985, 38 y.o.   MRN: [id number]  Chief Complaint  Patient presents with   Abdominal Pain    Referring provider: [facility]  Zachary Hood is a 38 y.o. male with Past Medical History: No date: Anemia No date: Anxiety No date: Asthma No date: Bipolar 1 disorder (HCC) No date: Depression No date: PTSD (post-traumatic stress disorder)   HPI  Patient presents today for a follow-up visit.  He was seen here in August after an ED visit for intussusception/small bowel obstruction.  He was supposed to follow-up with [facility] [place] [place] [facility] and GI.  He failed to call back to either appointment.  I will give him the numbers again today so he can call and make appointments.  He does still have lower abdominal pain.  He does still have issues with constipation.  We will trial MiraLAX.  Patient does still follow with psychiatry.  He was recently hospitalized with suicidal thoughts. Denies f/c/s, n/v/d, hemoptysis, PND, leg swelling Denies chest pain or edema     Allergies  Allergen Reactions   Egg-Derived Products Anaphylaxis   Fish Allergy Anaphylaxis   Ibuprofen Anaphylaxis   Peanut Butter Flavoring Agent (Non-Screening) Anaphylaxis   Tylenol  [Acetaminophen ] Anaphylaxis   Penicillins Other (See Comments)    Pt remembers adverse reaction with last admin    Immunization History  Administered Date(s) Administered   Tdap 08/08/2015, 04/15/2020, 09/04/2020, 10/27/2021    Tobacco History: Social History   Tobacco Use  Smoking Status Some Days  Smokeless Tobacco Never   Ready to quit: No Counseling given: Yes   Outpatient Encounter Medications as of 11/08/2023  Medication Sig   gabapentin  (NEURONTIN ) 300 MG capsule Take 1 capsule (300 mg total) by mouth 3 (three) times daily.   ondansetron  (ZOFRAN ) 4 MG tablet Take 1 tablet (4 mg total) by mouth every 6 (six) hours.   oxyCODONE  (ROXICODONE ) 15 MG  immediate release tablet Take 1 tablet (15 mg total) by mouth every 8 (eight) hours as needed for up to 5 days for pain.   polyethylene glycol (MIRALAX) 17 g packet Take 17 g by mouth daily.   QUEtiapine  (SEROQUEL  XR) 150 MG 24 hr tablet Take 1 tablet (150 mg total) by mouth daily.   doxycycline  (VIBRAMYCIN ) 100 MG capsule Take 1 capsule (100 mg total) by mouth 2 (two) times daily. (Patient not taking: Reported on 09/14/2023)   mirtazapine  (REMERON ) 15 MG tablet Take 1 tablet (15 mg total) by mouth at bedtime.   nicotine  (NICODERM CQ  - DOSED IN MG/24 HOURS) 21 mg/24hr patch Place 1 patch (21 mg total) onto the skin daily. (Patient not taking: Reported on 09/14/2023)   No facility-administered encounter medications on file as of 11/08/2023.    Review of Systems  Review of Systems  Constitutional: Negative.   HENT: Negative.    Cardiovascular: Negative.   Gastrointestinal: Negative.   Allergic/Immunologic: Negative.   Neurological: Negative.   Psychiatric/Behavioral: Negative.       Objective:   BP 128/86   Pulse 76   Temp 98.1 F (36.7 C) (Oral)   Wt 169 lb (76.7 kg)   SpO2 98%   BMI 26.47 kg/m   Wt Readings from Last 5 Encounters:  11/08/23 169 lb (76.7 kg)  09/14/23 158 lb (71.7 kg)  09/12/23 130 lb (59 kg)  11/07/22 155 lb (70.3 kg)  04/26/22 155 lb (70.3 kg)     Physical Exam  Vitals and nursing note reviewed.  Constitutional:      General: He is not in acute distress.    Appearance: He is well-developed.  Cardiovascular:     Rate and Rhythm: Normal rate and regular rhythm.  Pulmonary:     Effort: Pulmonary effort is normal.     Breath sounds: Normal breath sounds.  Skin:    General: Skin is warm and dry.  Neurological:     Mental Status: He is alert and oriented to person, place, and time.       Assessment & Plan:   Constipation, unspecified constipation type -     Polyethylene Glycol 3350 ; Take 17 g by mouth daily.  Dispense: 14 each; Refill:  0  Intussusception intestine (HCC) -     oxyCODONE  HCl; Take 1 tablet (15 mg total) by mouth every 8 (eight) hours as needed for up to 5 days for pain.  Dispense: 15 tablet; Refill: 0  PTSD (post-traumatic stress disorder) -     Ambulatory referral to Psychiatry  Depression, unspecified depression type -     Ambulatory referral to Psychiatry  Anxiety -     Ambulatory referral to Psychiatry     Return in about 3 months (around 02/08/2024).   Jerrlyn Morel, NP 11/08/2023

## 2023-11-08 NOTE — Patient Instructions (Addendum)
 [facility]:  [address] [place] [address]   P:  [phone] F:  [phone]    [facility]:  [id number]  [address] [place], [place]

## 2023-12-05 ENCOUNTER — Ambulatory Visit (INDEPENDENT_AMBULATORY_CARE_PROVIDER_SITE_OTHER): Payer: MEDICAID | Admitting: Clinical

## 2023-12-05 ENCOUNTER — Encounter (HOSPITAL_COMMUNITY): Payer: Self-pay

## 2023-12-05 DIAGNOSIS — F200 Paranoid schizophrenia: Secondary | ICD-10-CM | POA: Diagnosis not present
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
## 2023-12-07 ENCOUNTER — Encounter: Payer: Self-pay | Admitting: Physician Assistant

## 2023-12-08 ENCOUNTER — Ambulatory Visit (INDEPENDENT_AMBULATORY_CARE_PROVIDER_SITE_OTHER): Payer: MEDICAID | Admitting: Physician Assistant

## 2023-12-08 ENCOUNTER — Encounter (HOSPITAL_COMMUNITY): Payer: Self-pay | Admitting: Physician Assistant

## 2023-12-08 VITALS — BP 132/69 | HR 63 | Ht 67.0 in | Wt 152.4 lb

## 2023-12-08 DIAGNOSIS — F411 Generalized anxiety disorder: Secondary | ICD-10-CM | POA: Diagnosis not present

## 2023-12-08 DIAGNOSIS — F4310 Post-traumatic stress disorder, unspecified: Secondary | ICD-10-CM

## 2023-12-08 DIAGNOSIS — F200 Paranoid schizophrenia: Secondary | ICD-10-CM | POA: Diagnosis not present

## 2023-12-08 MED ORDER — MIRTAZAPINE 15 MG PO TABS: 15.0000 mg | ORAL_TABLET | Freq: Every day | ORAL | 1 refills | Status: DC

## 2023-12-08 MED ORDER — ARIPIPRAZOLE 15 MG PO TABS: 15.0000 mg | ORAL_TABLET | Freq: Every day | ORAL | 1 refills | Status: DC

## 2023-12-08 MED ORDER — PROPRANOLOL HCL 10 MG PO TABS: 10.0000 mg | ORAL_TABLET | Freq: Two times a day (BID) | ORAL | 1 refills | Status: DC | PRN

## 2023-12-08 MED ORDER — ARIPIPRAZOLE 10 MG PO TABS: 10.0000 mg | ORAL_TABLET | Freq: Every day | ORAL | 0 refills | Status: DC

## 2023-12-08 MED ORDER — MIRTAZAPINE 7.5 MG PO TABS: 7.5000 mg | ORAL_TABLET | Freq: Every day | ORAL | 0 refills | Status: DC
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
## 2023-12-11 ENCOUNTER — Emergency Department (HOSPITAL_BASED_OUTPATIENT_CLINIC_OR_DEPARTMENT_OTHER): Payer: MEDICAID | Admitting: Radiology

## 2023-12-11 ENCOUNTER — Emergency Department (HOSPITAL_BASED_OUTPATIENT_CLINIC_OR_DEPARTMENT_OTHER): Payer: MEDICAID

## 2023-12-11 ENCOUNTER — Other Ambulatory Visit (HOSPITAL_COMMUNITY): Payer: Self-pay

## 2023-12-11 ENCOUNTER — Other Ambulatory Visit (HOSPITAL_BASED_OUTPATIENT_CLINIC_OR_DEPARTMENT_OTHER): Payer: Self-pay

## 2023-12-11 ENCOUNTER — Emergency Department (HOSPITAL_BASED_OUTPATIENT_CLINIC_OR_DEPARTMENT_OTHER)
Admission: EM | Admit: 2023-12-11 | Discharge: 2023-12-11 | Disposition: A | Discharge status: Home / Self Care | Payer: MEDICAID | Attending: Emergency Medicine | Admitting: Emergency Medicine

## 2023-12-11 ENCOUNTER — Other Ambulatory Visit: Payer: Self-pay

## 2023-12-11 ENCOUNTER — Encounter (HOSPITAL_BASED_OUTPATIENT_CLINIC_OR_DEPARTMENT_OTHER): Payer: Self-pay | Admitting: Emergency Medicine

## 2023-12-11 DIAGNOSIS — F17290 Nicotine dependence, other tobacco product, uncomplicated: Secondary | ICD-10-CM | POA: Diagnosis not present

## 2023-12-11 DIAGNOSIS — R079 Chest pain, unspecified: Secondary | ICD-10-CM | POA: Diagnosis not present

## 2023-12-11 DIAGNOSIS — R0789 Other chest pain: Secondary | ICD-10-CM

## 2023-12-11 DIAGNOSIS — J45909 Unspecified asthma, uncomplicated: Secondary | ICD-10-CM | POA: Diagnosis not present

## 2023-12-11 DIAGNOSIS — Z91010 Allergy to peanuts: Secondary | ICD-10-CM | POA: Insufficient documentation

## 2023-12-11 DIAGNOSIS — R109 Unspecified abdominal pain: Secondary | ICD-10-CM | POA: Diagnosis present

## 2023-12-11 DIAGNOSIS — R1084 Generalized abdominal pain: Secondary | ICD-10-CM | POA: Insufficient documentation

## 2023-12-11 DIAGNOSIS — R0781 Pleurodynia: Secondary | ICD-10-CM | POA: Insufficient documentation

## 2023-12-11 DIAGNOSIS — M791 Myalgia, unspecified site: Secondary | ICD-10-CM

## 2023-12-11 LAB — COMPREHENSIVE METABOLIC PANEL WITH GFR
ALT: 55 U/L — ABNORMAL HIGH (ref 0–44)
AST: 44 U/L — ABNORMAL HIGH (ref 15–41)
Albumin: 4.6 g/dL (ref 3.5–5.0)
Alkaline Phosphatase: 95 U/L (ref 38–126)
Anion gap: 13 (ref 5–15)
BUN: 14 mg/dL (ref 6–20)
CO2: 23 mmol/L (ref 22–32)
Calcium: 10.1 mg/dL (ref 8.9–10.3)
Chloride: 103 mmol/L (ref 98–111)
Creatinine, Ser: 1.6 mg/dL — ABNORMAL HIGH (ref 0.61–1.24)
GFR, Estimated: 56 mL/min — ABNORMAL LOW (ref >60)
Glucose, Bld: 132 mg/dL — ABNORMAL HIGH (ref 70–99)
Potassium: 4.3 mmol/L (ref 3.5–5.1)
Sodium: 139 mmol/L (ref 135–145)
Total Bilirubin: 0.7 mg/dL (ref 0.0–1.2)
Total Protein: 8 g/dL (ref 6.5–8.1)

## 2023-12-11 LAB — URINALYSIS, ROUTINE W REFLEX MICROSCOPIC
Bilirubin Urine: NEGATIVE
Glucose, UA: NEGATIVE mg/dL
Hgb urine dipstick: NEGATIVE
Ketones, ur: NEGATIVE mg/dL
Leukocytes,Ua: NEGATIVE
Nitrite: NEGATIVE
Specific Gravity, Urine: 1.027 (ref 1.005–1.030)
pH: 6 (ref 5.0–8.0)

## 2023-12-11 LAB — CBC
HCT: 47.5 % (ref 39.0–52.0)
Hemoglobin: 15.5 g/dL (ref 13.0–17.0)
MCH: 24.1 pg — ABNORMAL LOW (ref 26.0–34.0)
MCHC: 32.6 g/dL (ref 30.0–36.0)
MCV: 74 fL — ABNORMAL LOW (ref 80.0–100.0)
Platelets: 230 10*3/uL (ref 150–400)
RBC: 6.42 MIL/uL — ABNORMAL HIGH (ref 4.22–5.81)
RDW: 15.9 % — ABNORMAL HIGH (ref 11.5–15.5)
WBC: 10.2 10*3/uL (ref 4.0–10.5)
nRBC: 0 % (ref 0.0–0.2)

## 2023-12-11 LAB — CK: Total CK: 554 U/L — ABNORMAL HIGH (ref 49–397)

## 2023-12-11 LAB — RESP PANEL BY RT-PCR (RSV, FLU A&B, COVID)  RVPGX2
Influenza A by PCR: NEGATIVE
Influenza B by PCR: NEGATIVE
Resp Syncytial Virus by PCR: NEGATIVE
SARS Coronavirus 2 by RT PCR: NEGATIVE

## 2023-12-11 LAB — LIPASE, BLOOD: Lipase: 35 U/L (ref 11–51)

## 2023-12-11 MED ORDER — CYCLOBENZAPRINE HCL 10 MG PO TABS
10.0000 mg | ORAL_TABLET | Freq: Once | ORAL | Status: AC
  Administered 2023-12-11: 10 mg via ORAL
  Filled 2023-12-11: qty 1

## 2023-12-11 MED ORDER — IOHEXOL 300 MG/ML  SOLN
100.0000 mL | Freq: Once | INTRAMUSCULAR | Status: AC | PRN
  Administered 2023-12-11: 80 mL via INTRAVENOUS

## 2023-12-11 MED ORDER — DIPHENHYDRAMINE HCL 50 MG/ML IJ SOLN
25.0000 mg | Freq: Once | INTRAMUSCULAR | Status: AC
  Administered 2023-12-11: 25 mg via INTRAVENOUS
  Filled 2023-12-11: qty 1

## 2023-12-11 MED ORDER — SODIUM CHLORIDE 0.9 % IV BOLUS
1000.0000 mL | Freq: Once | INTRAVENOUS | Status: AC
  Administered 2023-12-11: 1000 mL via INTRAVENOUS

## 2023-12-11 MED ORDER — CYCLOBENZAPRINE HCL 10 MG PO TABS
10.0000 mg | ORAL_TABLET | Freq: Two times a day (BID) | ORAL | 0 refills | Status: AC | PRN
Start: 2023-12-11 — End: ?
  Filled 2023-12-11: qty 20, 10d supply, fill #0

## 2023-12-11 MED ORDER — EPINEPHRINE 0.3 MG/0.3ML IJ SOAJ
0.3000 mg | INTRAMUSCULAR | 0 refills | Status: AC | PRN
  Filled 2023-12-11: qty 2, 2d supply, fill #0

## 2023-12-11 MED ORDER — OXYCODONE HCL 5 MG PO TABS
10.0000 mg | ORAL_TABLET | Freq: Once | ORAL | Status: AC
  Administered 2023-12-11: 10 mg via ORAL
  Filled 2023-12-11: qty 2

## 2023-12-11 NOTE — ED Triage Notes (Addendum)
 Pt via pov from home; reports that he has generalized abdominal pain, ribcage pain upon inspiration, leg cramps at night, arm pain, real bad pain shooting through my body.  Also has knot on rectum that bleeds when he wipes. Pt alert & oriented, nad noted.
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
## 2023-12-11 NOTE — Discharge Instructions (Addendum)
 As discussed, your workup today was reassuring.  Imaging of your chest and abdomen did not show anything acutely abnormal as cause of your symptoms.  Your blood work did not show anything obvious either as cause of your symptoms.  Recommend follow-up with your primary care for reassessment as well as pain management.  Please do not hesitate to return to the emergency department if the worrisome signs and symptoms we discussed become apparent.
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
## 2024-01-10 ENCOUNTER — Ambulatory Visit (INDEPENDENT_AMBULATORY_CARE_PROVIDER_SITE_OTHER): Payer: MEDICAID | Admitting: Physician Assistant

## 2024-01-10 ENCOUNTER — Encounter: Payer: Self-pay | Admitting: Physician Assistant

## 2024-01-10 ENCOUNTER — Other Ambulatory Visit (INDEPENDENT_AMBULATORY_CARE_PROVIDER_SITE_OTHER): Payer: MEDICAID

## 2024-01-10 VITALS — BP 118/60 | HR 76 | Ht 67.0 in | Wt 158.0 lb

## 2024-01-10 DIAGNOSIS — K219 Gastro-esophageal reflux disease without esophagitis: Secondary | ICD-10-CM

## 2024-01-10 DIAGNOSIS — R7989 Other specified abnormal findings of blood chemistry: Secondary | ICD-10-CM

## 2024-01-10 DIAGNOSIS — K625 Hemorrhage of anus and rectum: Secondary | ICD-10-CM

## 2024-01-10 DIAGNOSIS — K5900 Constipation, unspecified: Secondary | ICD-10-CM

## 2024-01-10 DIAGNOSIS — Z9889 Other specified postprocedural states: Secondary | ICD-10-CM

## 2024-01-10 LAB — HEPATIC FUNCTION PANEL
ALT: 28 U/L (ref 0–53)
AST: 26 U/L (ref 0–37)
Albumin: 4.6 g/dL (ref 3.5–5.2)
Alkaline Phosphatase: 77 U/L (ref 39–117)
Bilirubin, Direct: 0.2 mg/dL (ref 0.0–0.3)
Total Bilirubin: 1 mg/dL (ref 0.2–1.2)
Total Protein: 7.6 g/dL (ref 6.0–8.3)

## 2024-01-10 MED ORDER — PANTOPRAZOLE SODIUM 40 MG PO TBEC: 40.0000 mg | DELAYED_RELEASE_TABLET | Freq: Every day | ORAL | 3 refills | Status: DC

## 2024-01-10 MED ORDER — NA SULFATE-K SULFATE-MG SULF 17.5-3.13-1.6 GM/177ML PO SOLN: 1.0000 | Freq: Once | ORAL | 0 refills | Status: AC

## 2024-01-10 MED ORDER — ONDANSETRON HCL 4 MG PO TABS: 4.0000 mg | ORAL_TABLET | Freq: Four times a day (QID) | ORAL | 0 refills | Status: AC

## 2024-01-10 NOTE — Patient Instructions (Signed)
 Your provider has requested that you go to the basement level for lab work before leaving today. Press B on the elevator. The lab is located at the first door on the left as you exit the elevator.  We have sent the following medications to your pharmacy for you to pick up at your convenience: pantoprazole  40 mg once daily+  You have been scheduled for a Colonoscopy. Please follow written instructions given to you at your visit today.   If you use inhalers (even only as needed), please bring them with you on the day of your procedure.  DO NOT TAKE 7 DAYS PRIOR TO TEST- Trulicity (dulaglutide) Ozempic, Wegovy (semaglutide) Mounjaro (tirzepatide) Bydureon Bcise (exanatide extended release)  DO NOT TAKE 1 DAY PRIOR TO YOUR TEST Rybelsus (semaglutide) Adlyxin (lixisenatide) Victoza (liraglutide) Byetta (exanatide) ___________________________________________________________________________  Please follow up sooner if symptoms increase or worsen   Due to recent changes in healthcare laws, you may see the results of your imaging and laboratory studies on MyChart before your provider has had a chance to review them.  We understand that in some cases there may be results that are confusing or concerning to you. Not all laboratory results come back in the same time frame and the provider may be waiting for multiple results in order to interpret others.  Please give us  48 hours in order for your provider to thoroughly review all the results before contacting the office for clarification of your results.   Thank you for trusting me with your gastrointestinal care!   [name], PA-C _______________________________________________________  If your blood pressure at your visit was 140/90 or greater, please contact your primary care physician to follow up on this.  _______________________________________________________  If you are age 29 or older, your body mass index should be between 23-30.  Your Body mass index is 24.75 kg/m. If this is out of the aforementioned range listed, please consider follow up with your Primary Care Provider.  If you are age 63 or younger, your body mass index should be between 19-25. Your Body mass index is 24.75 kg/m. If this is out of the aformentioned range listed, please consider follow up with your Primary Care Provider.   ________________________________________________________  The [facility] providers would like to encourage you to use MYCHART to communicate with providers for non-urgent requests or questions.  Due to long hold times on the telephone, sending your provider a message by [facility] may be a faster and more efficient way to get a response.  Please allow 48 business hours for a response.  Please remember that this is for non-urgent requests.  _______________________________________________________

## 2024-01-13 LAB — IRON,TIBC AND FERRITIN PANEL
%SAT: 18 % — ABNORMAL LOW (ref 20–48)
Ferritin: 98 ng/mL (ref 38–380)
Iron: 74 ug/dL (ref 50–180)
TIBC: 401 ug/dL (ref 250–425)

## 2024-01-13 LAB — ALPHA-1-ANTITRYPSIN: A-1 Antitrypsin, Ser: 139 mg/dL (ref 83–199)

## 2024-01-13 LAB — CERULOPLASMIN: Ceruloplasmin: 24 mg/dL (ref 14–30)

## 2024-01-13 LAB — HEPATITIS A ANTIBODY, TOTAL: Hepatitis A AB,Total: NONREACTIVE

## 2024-01-13 LAB — ANTI-SMOOTH MUSCLE ANTIBODY, IGG: Actin (Smooth Muscle) Antibody (IGG): <20 U (ref <20)

## 2024-01-13 LAB — ANA: Anti Nuclear Antibody (ANA): NEGATIVE

## 2024-01-13 LAB — HEPATITIS B CORE ANTIBODY, IGM: Hep B C IgM: NONREACTIVE

## 2024-01-13 LAB — HEPATITIS C ANTIBODY: Hepatitis C Ab: NONREACTIVE

## 2024-01-13 LAB — HEPATITIS A ANTIBODY, IGM: Hep A IgM: NONREACTIVE

## 2024-01-13 LAB — HEPATITIS B SURFACE ANTIBODY,QUALITATIVE: Hep B S Ab: NONREACTIVE

## 2024-01-13 LAB — MITOCHONDRIAL ANTIBODIES: Mitochondrial M2 Ab, IgG: <=20 U (ref <=20.0)

## 2024-01-13 LAB — HEPATITIS B CORE ANTIBODY, TOTAL: Hep B Core Total Ab: NONREACTIVE

## 2024-01-15 ENCOUNTER — Encounter (HOSPITAL_COMMUNITY): Payer: MEDICAID | Admitting: Physician Assistant

## 2024-01-15 ENCOUNTER — Ambulatory Visit: Payer: Self-pay | Admitting: Physician Assistant

## 2024-01-15 ENCOUNTER — Encounter (HOSPITAL_COMMUNITY): Payer: Self-pay

## 2024-01-15 NOTE — Progress Notes (Signed)
 Notify patient labs show: 1.  Normal total iron and ferritin.  Slightly low iron saturation.  No evidence of hemochromatosis or iron overload.  Recommend OTC multivitamin with iron daily. 2.  Viral hepatitis A, B, and C labs are negative.  He is not immune to hepatitis A or B.  **Please offer hepatitis A and B vaccines. 3.  AMA, ANA, and ASMA labs are normal/negative.  No evidence of PBC or autoimmune hepatitis. 4.  Ceruloplasmin and alpha-1 antitrypsin test are normal.  No evidence of Wilson's disease or alpha-1 antitrypsin deficiency. 5.  All liver test have returned to normal.  I recommend continue to decrease and abstain from drinking alcohol.  Nothing worrisome.  Continue with current plan. [name], PA-C

## 2024-01-16 ENCOUNTER — Encounter: Payer: MEDICAID | Admitting: Gastroenterology

## 2024-01-24 ENCOUNTER — Encounter: Payer: MEDICAID | Admitting: Gastroenterology

## 2024-01-24 ENCOUNTER — Telehealth: Payer: Self-pay | Admitting: Gastroenterology

## 2024-01-24 NOTE — Telephone Encounter (Signed)
 Hi Dr Stacia,   Per Rock Boone, patient called to reschedule his procedure due to transportation. Pt stated he tried calling last week to cancel. I will no show him for today's procedure.   Thank you

## 2024-01-25 NOTE — Progress Notes (Signed)
 Agree with the assessment and plan as outlined by Brigitte Canard, PA-C.

## 2024-02-06 ENCOUNTER — Encounter: Payer: MEDICAID | Admitting: Gastroenterology

## 2024-02-08 ENCOUNTER — Telehealth: Payer: Self-pay

## 2024-02-08 ENCOUNTER — Ambulatory Visit: Payer: MEDICAID

## 2024-02-08 NOTE — Telephone Encounter (Signed)
 I left Zachary Hood a detailed message to call us  back to r/s his Heplisav as he No Showed today.

## 2024-02-13 ENCOUNTER — Encounter (HOSPITAL_COMMUNITY): Payer: Self-pay

## 2024-02-13 ENCOUNTER — Telehealth (HOSPITAL_COMMUNITY): Payer: MEDICAID | Admitting: Physician Assistant

## 2024-02-21 ENCOUNTER — Encounter (HOSPITAL_COMMUNITY): Payer: Self-pay

## 2024-02-21 ENCOUNTER — Telehealth (HOSPITAL_COMMUNITY): Payer: MEDICAID | Admitting: Physician Assistant

## 2024-02-23 ENCOUNTER — Ambulatory Visit: Payer: Self-pay

## 2024-02-23 ENCOUNTER — Telehealth (INDEPENDENT_AMBULATORY_CARE_PROVIDER_SITE_OTHER): Payer: MEDICAID | Admitting: Physician Assistant

## 2024-02-23 DIAGNOSIS — F411 Generalized anxiety disorder: Secondary | ICD-10-CM

## 2024-02-23 DIAGNOSIS — F200 Paranoid schizophrenia: Secondary | ICD-10-CM | POA: Diagnosis not present

## 2024-02-23 DIAGNOSIS — F4310 Post-traumatic stress disorder, unspecified: Secondary | ICD-10-CM | POA: Diagnosis not present

## 2024-02-23 MED ORDER — ARIPIPRAZOLE 15 MG PO TABS: 15.0000 mg | ORAL_TABLET | Freq: Every day | ORAL | 1 refills | Status: DC

## 2024-02-23 MED ORDER — GABAPENTIN 400 MG PO CAPS: 400.0000 mg | ORAL_CAPSULE | Freq: Three times a day (TID) | ORAL | 1 refills | Status: DC

## 2024-02-23 MED ORDER — PROPRANOLOL HCL 20 MG PO TABS: 20.0000 mg | ORAL_TABLET | Freq: Two times a day (BID) | ORAL | 1 refills | Status: DC | PRN

## 2024-02-23 MED ORDER — MIRTAZAPINE 30 MG PO TABS: 30.0000 mg | ORAL_TABLET | Freq: Every day | ORAL | 1 refills | Status: AC

## 2024-02-23 NOTE — Telephone Encounter (Signed)
 Called the [facility] and advised them of ER disposition for the patient and the patient being unsure about if he is going to go to the ER at this time.
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
## 2024-02-23 NOTE — Telephone Encounter (Signed)
 FYI Only or Action Required?: FYI only for provider.  Patient was last seen in primary care on 11/08/2023 by [name], NP.  Called Nurse Triage reporting Information Only.  Symptoms began today.   Triage Disposition: Information or Advice Only Call  Patient/caregiver understands and will follow disposition?: Yes   **See note below, and previous encounter**         Copied from CRM #[id number]. Topic: General - Call Back - No Documentation >> Feb 23, 2024  2:37 PM DeAngela L wrote: Reason for CRM: patient returned call transferred him to NT  Pt num [phone] Reason for Disposition  Health information question, no triage required and triager able to answer question  Answer Assessment - Initial Assessment Questions 1. REASON FOR CALL: What is the main reason for your call? or How can I best help you?   Patient spoke with RN earlier today, and she called patient back to see if he was going to the ED per the disposition; patient called back and stated yes, he will be going, just working on finding a ride.  Protocols used: Information Only Call - No Triage-A-AH

## 2024-02-23 NOTE — Telephone Encounter (Signed)
 Called pt to advise he was needing to go to the ER. No answer lvm to office back so I could see why he did not want to go.   Suzen Shove   CMA II
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
## 2024-03-05 NOTE — Telephone Encounter (Signed)
 I am mailing Zachary Hood a letter to please call us  and set up his #1 Heplisav. Maybe he will show up for his #2 Heplisav and we can just start from that date.

## 2024-03-11 ENCOUNTER — Encounter (HOSPITAL_COMMUNITY): Payer: Self-pay | Admitting: Physician Assistant

## 2024-03-14 ENCOUNTER — Ambulatory Visit: Payer: MEDICAID

## 2024-04-12 ENCOUNTER — Telehealth (HOSPITAL_COMMUNITY): Payer: MEDICAID | Admitting: Physician Assistant

## 2024-04-12 ENCOUNTER — Encounter (HOSPITAL_COMMUNITY): Payer: Self-pay

## 2024-05-24 ENCOUNTER — Telehealth (HOSPITAL_COMMUNITY): Payer: MEDICAID | Admitting: Physician Assistant

## 2024-05-24 ENCOUNTER — Encounter (HOSPITAL_COMMUNITY): Payer: Self-pay

## 2024-05-24 DIAGNOSIS — F5105 Insomnia due to other mental disorder: Secondary | ICD-10-CM

## 2024-05-24 DIAGNOSIS — F411 Generalized anxiety disorder: Secondary | ICD-10-CM | POA: Diagnosis not present

## 2024-05-24 DIAGNOSIS — F99 Mental disorder, not otherwise specified: Secondary | ICD-10-CM

## 2024-05-24 DIAGNOSIS — F4310 Post-traumatic stress disorder, unspecified: Secondary | ICD-10-CM

## 2024-05-24 DIAGNOSIS — F200 Paranoid schizophrenia: Secondary | ICD-10-CM | POA: Diagnosis not present

## 2024-05-24 MED ORDER — UNISOM SLEEPTABS 25 MG PO TABS
50.0000 mg | ORAL_TABLET | Freq: Every evening | ORAL | 1 refills | Status: DC | PRN
  Filled 2024-06-12: qty 64, 32d supply, fill #0

## 2024-05-24 MED ORDER — OLANZAPINE 10 MG PO TABS: 10.0000 mg | ORAL_TABLET | Freq: Every day | ORAL | 1 refills | Status: DC

## 2024-05-24 MED ORDER — GABAPENTIN 400 MG PO CAPS: 400.0000 mg | ORAL_CAPSULE | Freq: Three times a day (TID) | ORAL | 1 refills | Status: DC

## 2024-05-24 MED ORDER — PROPRANOLOL HCL 20 MG PO TABS: 20.0000 mg | ORAL_TABLET | Freq: Two times a day (BID) | ORAL | 1 refills | Status: DC | PRN

## 2024-05-24 MED ORDER — OLANZAPINE 5 MG PO TABS: ORAL_TABLET | ORAL | 0 refills | Status: DC

## 2024-05-24 MED ORDER — ARIPIPRAZOLE 5 MG PO TABS: ORAL_TABLET | ORAL | 0 refills | Status: AC

## 2024-05-29 ENCOUNTER — Telehealth (INDEPENDENT_AMBULATORY_CARE_PROVIDER_SITE_OTHER): Payer: MEDICAID | Admitting: Nurse Practitioner

## 2024-05-29 ENCOUNTER — Ambulatory Visit: Payer: Self-pay

## 2024-05-29 ENCOUNTER — Ambulatory Visit: Payer: Self-pay | Admitting: Nurse Practitioner

## 2024-05-29 ENCOUNTER — Encounter: Payer: Self-pay | Admitting: Nurse Practitioner

## 2024-05-29 VITALS — Ht 67.0 in | Wt 158.0 lb

## 2024-05-29 DIAGNOSIS — R634 Abnormal weight loss: Secondary | ICD-10-CM | POA: Insufficient documentation

## 2024-05-29 DIAGNOSIS — K219 Gastro-esophageal reflux disease without esophagitis: Secondary | ICD-10-CM

## 2024-05-29 DIAGNOSIS — F1090 Alcohol use, unspecified, uncomplicated: Secondary | ICD-10-CM

## 2024-05-29 DIAGNOSIS — M9908 Segmental and somatic dysfunction of rib cage: Secondary | ICD-10-CM

## 2024-05-29 DIAGNOSIS — F17200 Nicotine dependence, unspecified, uncomplicated: Secondary | ICD-10-CM

## 2024-05-29 DIAGNOSIS — R748 Abnormal levels of other serum enzymes: Secondary | ICD-10-CM | POA: Insufficient documentation

## 2024-05-29 MED ORDER — PANTOPRAZOLE SODIUM 40 MG PO TBEC
40.0000 mg | DELAYED_RELEASE_TABLET | Freq: Every day | ORAL | 0 refills | Status: AC
  Filled 2024-05-29: qty 90, 90d supply, fill #0

## 2024-05-29 NOTE — Assessment & Plan Note (Signed)
 Wt Readings from Last 3 Encounters:  05/29/24 158 lb (71.7 kg)  01/10/24 158 lb (71.7 kg)  12/11/23 152 lb (68.9 kg)   Unintentional weight loss with abdominal pain and nausea Chronic weight loss with abdominal pain and nausea. Normal CT scan.un sheable to verify current weight - Encouraged gastroenterologist follow-up for further evaluation, including potential colonoscopy. - Ordered labs to screen for infections

## 2024-05-29 NOTE — Progress Notes (Addendum)
 Virtual Visit via Video  Note  I connected with Zachary Hood @ on 05/29/2024 at  3:20 PM EST by video and verified that I am speaking with the correct person using two identifiers. I spent 8 minutes on this video  encounter   Location: Patient: home Provider: office   I discussed the limitations, risks, security and privacy concerns of performing an evaluation and management service by telephone and the availability of in person appointments. I also discussed with the patient that there may be a patient responsible charge related to this service. The patient expressed understanding and agreed to proceed.   History of Present Illness: Discussed the use of AI scribe software for clinical note transcription with the patient, who gave verbal consent to proceed.  History of Present Illness Zachary Hood is a 38 year old male  has a past medical history of Anemia, Anxiety, Asthma, Bipolar 1 disorder (HCC), Depression, and PTSD (post-traumatic stress disorder).  who presents with unexplained weight loss and generalized pain.  He has been experiencing generalized pain and significant weight loss. The pain is primarily located in his chest, rib area, shoulders, neck, and sometimes his thighs, and it has been worsening over time. He describes the sensation as 'losing weight throughout my body' and can feel his ribs and muscles 'sinking.'  He notes a decreased appetite, eating at certain times but experiencing a 'loss of appetite' at times. He also has stomach pain and nausea throughout the day. No blood in stool.  Earlier in the year, he was seen by a gastroenterologist, and a CT scan of his stomach returned normal results. However, he has not undergone a colonoscopy as previously planned due to personal issues, including legal troubles and mental health challenges, which have interfered with his ability to attend appointments.  His social history reveals excessive alcohol consumption, with drinking  occurring every other day or sometimes daily, and smoking of 'black and mild' in unspecified quantities.  He has previously been prescribed oxycodone  for pain management, which he found somewhat helpful, but has not had a refill in some time. He is unable to take Tylenol  or ibuprofen due to unspecified reasons documented in his medical history.     Assessment & Plan      Observations/Objective:  Patient alert and oriented no sign of distress noted  Assessment and Plan: Unintended weight loss Wt Readings from Last 3 Encounters:  05/29/24 158 lb (71.7 kg)  01/10/24 158 lb (71.7 kg)  12/11/23 152 lb (68.9 kg)   Unintentional weight loss with abdominal pain and nausea Chronic weight loss with abdominal pain and nausea. Normal CT scan.un sheable to verify current weight - Encouraged gastroenterologist follow-up for further evaluation, including potential colonoscopy. - Ordered labs to screen for infections   Rib cage dysfunction Pain in rib, spine, chest, and stomach. Previous Percocet prescription. Limited options due to contraindications with Tylenol  and ibuprofen. - Order chest x-ray to evaluate rib pain. - Communicate with prescribing provider regarding pain management options. Advised to take Tylenol  as needed for pain    Alcohol use Alcohol use disorder with abnormal liver enzymes Chronic alcohol use with past  elevated liver enzymes. Advised on risks of alcohol consumption. - Advised cessation of alcohol consumption.  Tobacco use disorder  Chronic tobacco use with smoking of black and mild. Advised on risks of smoking. - Advised cessation of smoking.  Gastroesophageal reflux disease Pain in the ribcage area, chest could be due to GERD, Pantoprazole  40 mg daily that  was prescribed by GI it does not appear that the patient has not been taking the medication or ever filled the prescription. Med refilled   Follow Up Instructions:    I discussed the assessment and  treatment plan with the patient. The patient was provided an opportunity to ask questions and all were answered. The patient agreed with the plan and demonstrated an understanding of the instructions.   The patient was advised to call back or seek an in-person evaluation if the symptoms worsen or if the condition fails to improve as anticipated.

## 2024-05-29 NOTE — Patient Instructions (Addendum)
 Please follow up with [facility]  as dicussed [phone]  1. Unintended weight loss (Primary) - CBC; Future - CMP14+EGFR; Future - HepB+HepC+HIV Panel; Future - RPR; Future - Chlamydia/Gonococcus/Trichomonas, NAA; Future - TSH; Future  2. Rib cage dysfunction - DG Chest 2 View; Future  3. Gastroesophageal reflux disease, unspecified whether esophagitis present - pantoprazole  (PROTONIX ) 40 MG tablet; Take 1 tablet (40 mg total) by mouth daily.  Dispense: 90 tablet; Refill: 0  4. Alcohol use  5. Tobacco use disorder   Please get your x-ray done at [facility] Address: [address] [place], [place], [place] [address] Phone: [phone]     It is important that you exercise regularly at least 30 minutes 5 times a week as tolerated  Think about what you will eat, plan ahead. Choose  clean, green, fresh or frozen over canned, processed or packaged foods which are more sugary, salty and fatty. 70 to 75% of food eaten should be vegetables and fruit. Three meals at set times with snacks allowed between meals, but they must be fruit or vegetables. Aim to eat over a 12 hour period , example 7 am to 7 pm, and STOP after  your last meal of the day. Drink water ,generally about 64 ounces per day, no other drink is as healthy. Fruit juice is best enjoyed in a healthy way, by EATING the fruit.  Thanks for choosing [facility] we consider it a privelige to serve you.

## 2024-05-29 NOTE — Assessment & Plan Note (Signed)
 Alcohol use disorder with abnormal liver enzymes Chronic alcohol use with past  elevated liver enzymes. Advised on risks of alcohol consumption. - Advised cessation of alcohol consumption.

## 2024-05-29 NOTE — Assessment & Plan Note (Signed)
°  Chronic tobacco use with smoking of black and mild. Advised on risks of smoking. - Advised cessation of smoking.

## 2024-05-29 NOTE — Assessment & Plan Note (Addendum)
 Pain in the ribcage area, chest could be due to GERD, Pantoprazole  40 mg daily that was prescribed by GI it does not appear that the patient has not been taking the medication or ever filled the prescription. Med refilled

## 2024-05-29 NOTE — Assessment & Plan Note (Addendum)
 Pain in rib, spine, chest, and stomach. Previous Percocet prescription. Limited options due to contraindications with Tylenol  and ibuprofen. - Order chest x-ray to evaluate rib pain. - Communicate with prescribing provider regarding pain management options. Advised to take Tylenol  as needed for pain

## 2024-05-29 NOTE — Telephone Encounter (Signed)
 Called [facility] and advised Zachary Hood of patient not being able to come in today for his appointment due to transportation problems and refusing the ER at this time for severe pain  FYI Only or Action Required?: Action required by provider: request for appointment, clinical question for provider, update on patient condition, and ER Refusal.  Patient was last seen in primary care on 11/08/2023 by [name], NP.  Called Nurse Triage reporting Pain.  Symptoms began patient states since he first started seeing Bascom Oley NP.  Interventions attempted: Rest, hydration, or home remedies.  Symptoms are: rapidly worsening.  Triage Disposition: Go to ED Now (Notify PCP)  Patient/caregiver understands and will follow disposition?:                    Copied from CRM [id number]. Topic: Clinical - Red Word Triage >> May 29, 2024  2:56 PM Montie POUR wrote: Red Word that prompted transfer to Nurse Triage:  He is having pain in his shoulder, ribs, legs, stomach. Pain level is over a 10. He is losing weight. Reason for Disposition  [1] SEVERE pain (e.g., excruciating) AND [2] present > 1 hour  Answer Assessment - Initial Assessment Questions Patient states that he is having pain---abdomen and ribs and chest and legs, calf muscles, neck He states that he is losing weight as well---  Patient states he never went to Urgent Care after being advised this in the past Patient states he also has nausea  Patient states pain is 10 out of 10 Patient was advised that  Abdominal pain hurts every day off and on Patient denies diarrhea, vomiting blood, blood in urine Patient states he is worried about what is going on in his abdomen & states he was supposed to have procedures done at some point but was unable to do so--unsure exactly what procedures involving his abdomen  Patient states he has no pain medication and has not taken anything over the counter for pain He is advised with severe pain  in his abdomen, ribs, chest, legs, calves---it is recommended that he goes to the Emergency Room for further evaluation Patient wants to see his PCP and does not want to go to the ER  Patient is advised to call us  back if anything changes or with any further questions/concerns. Patient is advised that if anything worsens to call 911. Patient verbalized understanding.  Protocols used: Abdominal Pain - Male-A-AH

## 2024-05-30 ENCOUNTER — Telehealth: Payer: Self-pay | Admitting: Nurse Practitioner

## 2024-05-30 ENCOUNTER — Other Ambulatory Visit (HOSPITAL_BASED_OUTPATIENT_CLINIC_OR_DEPARTMENT_OTHER): Payer: Self-pay

## 2024-05-30 NOTE — Telephone Encounter (Signed)
 He was seen via my chart. KH

## 2024-05-30 NOTE — Telephone Encounter (Signed)
 Spoke to pt yesterday. KH

## 2024-05-30 NOTE — Telephone Encounter (Unsigned)
 Copied from CRM #[id number]. Topic: General - Call Back - No Documentation >> May 30, 2024  8:59 AM Antony RAMAN wrote: Reason for CRM: patient has had 2 missed calls and theres no notes indicating what for so please advise

## 2024-05-30 NOTE — Telephone Encounter (Signed)
 Copied from CRM #[id number]. Topic: General - Call Back - No Documentation >> May 30, 2024  8:59 AM Antony RAMAN wrote: Reason for CRM: patient has had 2 missed calls and theres no notes indicating what for so please advise

## 2024-05-30 NOTE — Telephone Encounter (Signed)
FYI [name]

## 2024-05-31 ENCOUNTER — Other Ambulatory Visit: Payer: Self-pay

## 2024-05-31 DIAGNOSIS — R634 Abnormal weight loss: Secondary | ICD-10-CM

## 2024-06-01 LAB — CMP14+EGFR
ALT: 41 IU/L (ref 0–44)
AST: 50 IU/L — ABNORMAL HIGH (ref 0–40)
Albumin: 4.5 g/dL (ref 4.1–5.1)
Alkaline Phosphatase: 82 IU/L (ref 47–123)
BUN/Creatinine Ratio: 10 (ref 9–20)
BUN: 11 mg/dL (ref 6–24)
Bilirubin Total: 0.4 mg/dL (ref 0.0–1.2)
CO2: 21 mmol/L (ref 20–29)
Calcium: 9.6 mg/dL (ref 8.7–10.2)
Chloride: 104 mmol/L (ref 96–106)
Creatinine, Ser: 1.1 mg/dL (ref 0.76–1.27)
Globulin, Total: 2.4 g/dL (ref 1.5–4.5)
Glucose: 82 mg/dL (ref 70–99)
Potassium: 3.9 mmol/L (ref 3.5–5.2)
Sodium: 141 mmol/L (ref 134–144)
Total Protein: 6.9 g/dL (ref 6.0–8.5)
eGFR: 87 mL/min/1.73

## 2024-06-01 LAB — CBC
Hematocrit: 45.9 % (ref 37.5–51.0)
Hemoglobin: 13.9 g/dL (ref 13.0–17.7)
MCH: 24.3 pg — ABNORMAL LOW (ref 26.6–33.0)
MCHC: 30.3 g/dL — ABNORMAL LOW (ref 31.5–35.7)
MCV: 80 fL (ref 79–97)
Platelets: 246 x10E3/uL (ref 150–450)
RBC: 5.71 x10E6/uL (ref 4.14–5.80)
RDW: 14.2 % (ref 11.6–15.4)
WBC: 7.7 x10E3/uL (ref 3.4–10.8)

## 2024-06-01 LAB — HEPB+HEPC+HIV PANEL
HIV Screen 4th Generation wRfx: NONREACTIVE
Hep B C IgM: NEGATIVE
Hep B Core Total Ab: NEGATIVE
Hep B E Ab: NONREACTIVE
Hep B E Ag: NEGATIVE
Hep B Surface Ab, Qual: NONREACTIVE
Hep C Virus Ab: NONREACTIVE
Hepatitis B Surface Ag: NEGATIVE

## 2024-06-01 LAB — TSH: TSH: 0.505 u[IU]/mL (ref 0.450–4.500)

## 2024-06-01 LAB — SYPHILIS: RPR W/REFLEX TO RPR TITER AND TREPONEMAL ANTIBODIES, TRADITIONAL SCREENING AND DIAGNOSIS ALGORITHM: RPR Ser Ql: NONREACTIVE

## 2024-06-03 ENCOUNTER — Ambulatory Visit: Payer: Self-pay | Admitting: Nurse Practitioner

## 2024-06-03 DIAGNOSIS — R718 Other abnormality of red blood cells: Secondary | ICD-10-CM

## 2024-06-03 LAB — CHLAMYDIA/GONOCOCCUS/TRICHOMONAS, NAA
Chlamydia by NAA: NEGATIVE
Gonococcus by NAA: NEGATIVE
Trich vag by NAA: NEGATIVE

## 2024-06-04 ENCOUNTER — Emergency Department (HOSPITAL_BASED_OUTPATIENT_CLINIC_OR_DEPARTMENT_OTHER)
Admission: EM | Admit: 2024-06-04 | Discharge: 2024-06-04 | Disposition: A | Discharge status: Home / Self Care | Payer: MEDICAID | Attending: Emergency Medicine | Admitting: Emergency Medicine

## 2024-06-04 ENCOUNTER — Encounter (HOSPITAL_BASED_OUTPATIENT_CLINIC_OR_DEPARTMENT_OTHER): Payer: Self-pay

## 2024-06-04 ENCOUNTER — Other Ambulatory Visit: Payer: Self-pay

## 2024-06-04 DIAGNOSIS — R634 Abnormal weight loss: Secondary | ICD-10-CM

## 2024-06-04 DIAGNOSIS — J101 Influenza due to other identified influenza virus with other respiratory manifestations: Secondary | ICD-10-CM | POA: Diagnosis not present

## 2024-06-04 DIAGNOSIS — Z91010 Allergy to peanuts: Secondary | ICD-10-CM | POA: Insufficient documentation

## 2024-06-04 DIAGNOSIS — R1084 Generalized abdominal pain: Secondary | ICD-10-CM | POA: Diagnosis present

## 2024-06-04 LAB — COMPREHENSIVE METABOLIC PANEL WITH GFR
ALT: 36 U/L (ref 0–44)
AST: 35 U/L (ref 15–41)
Albumin: 4.8 g/dL (ref 3.5–5.0)
Alkaline Phosphatase: 85 U/L (ref 38–126)
Anion gap: 14 (ref 5–15)
BUN: 10 mg/dL (ref 6–20)
CO2: 22 mmol/L (ref 22–32)
Calcium: 9.8 mg/dL (ref 8.9–10.3)
Chloride: 102 mmol/L (ref 98–111)
Creatinine, Ser: 1.08 mg/dL (ref 0.61–1.24)
GFR, Estimated: >60 mL/min
Glucose, Bld: 90 mg/dL (ref 70–99)
Potassium: 4.1 mmol/L (ref 3.5–5.1)
Sodium: 138 mmol/L (ref 135–145)
Total Bilirubin: 0.7 mg/dL (ref 0.0–1.2)
Total Protein: 8.1 g/dL (ref 6.5–8.1)

## 2024-06-04 LAB — CBC
HCT: 44.5 % (ref 39.0–52.0)
Hemoglobin: 14.6 g/dL (ref 13.0–17.0)
MCH: 24.1 pg — ABNORMAL LOW (ref 26.0–34.0)
MCHC: 32.8 g/dL (ref 30.0–36.0)
MCV: 73.6 fL — ABNORMAL LOW (ref 80.0–100.0)
Platelets: 210 K/uL (ref 150–400)
RBC: 6.05 MIL/uL — ABNORMAL HIGH (ref 4.22–5.81)
RDW: 14.9 % (ref 11.5–15.5)
WBC: 7.1 K/uL (ref 4.0–10.5)
nRBC: 0 % (ref 0.0–0.2)

## 2024-06-04 LAB — TROPONIN T, HIGH SENSITIVITY
Troponin T High Sensitivity: 29 ng/L — ABNORMAL HIGH (ref 0–19)
Troponin T High Sensitivity: 31 ng/L — ABNORMAL HIGH (ref 0–19)

## 2024-06-04 LAB — RESP PANEL BY RT-PCR (RSV, FLU A&B, COVID)  RVPGX2
Influenza A by PCR: POSITIVE — AB
Influenza B by PCR: NEGATIVE
Resp Syncytial Virus by PCR: NEGATIVE
SARS Coronavirus 2 by RT PCR: NEGATIVE

## 2024-06-04 LAB — LIPASE, BLOOD: Lipase: 22 U/L (ref 11–51)

## 2024-06-04 MED ORDER — ONDANSETRON 4 MG PO TBDP
4.0000 mg | ORAL_TABLET | ORAL | 0 refills | Status: AC | PRN
  Filled 2024-06-12: qty 20, 4d supply, fill #0

## 2024-06-04 MED ORDER — SODIUM CHLORIDE 0.9 % IV BOLUS
1000.0000 mL | Freq: Once | INTRAVENOUS | Status: AC
  Administered 2024-06-04: 1000 mL via INTRAVENOUS

## 2024-06-04 MED ORDER — BENZONATATE 100 MG PO CAPS
100.0000 mg | ORAL_CAPSULE | Freq: Three times a day (TID) | ORAL | 0 refills | Status: AC
  Filled 2024-06-12: qty 21, 7d supply, fill #0

## 2024-06-04 NOTE — Discharge Instructions (Addendum)
 Follow up with your family doc in the office.  Follow up with GI.   Take tylenol  2 pills 4 times a day and motrin 4 pills 3 times a day.  Drink plenty of fluids.  Return for worsening shortness of breath, headache, confusion. Follow up with your family doctor.

## 2024-06-04 NOTE — ED Triage Notes (Signed)
 Reports generalized abd pain, generalized chest pain, SHOB, weight loss for multiple months.   Also reports cough congestion, nausea, emesis, chills since yesterday

## 2024-06-04 NOTE — ED Provider Notes (Signed)
 " [place] [facility] Provider Note   CSN: [id number] Arrival date & time: 06/04/24  1107     Patient presents with: mulitple complaints   Zachary Hood is a 38 y.o. male.   38 yo M with a chief complaints of diffuse abdominal and chest discomfort that has been going on for months.  Having weight loss but this is well.  Has been able to eat and drink without issue.  No issues moving his bowels.  No vomiting.  Has had some loose stools at times.  He was scheduled to see a gastroenterologist for this but missed his appointment.  He also has recently ill cough congestion going on since yesterday.  No known sick contacts no recent travel.        Prior to Admission medications  Medication Sig Start Date End Date Taking? Authorizing Provider  benzonatate  (TESSALON ) 100 MG capsule Take 1 capsule (100 mg total) by mouth every 8 (eight) hours. 06/04/24  Yes Emil Share, DO  ondansetron  (ZOFRAN -ODT) 4 MG disintegrating tablet 4mg  ODT q4 hours prn nausea/vomit 06/04/24  Yes Kashayla Ungerer, DO  ARIPiprazole  (ABILIFY ) 5 MG tablet Take 1 tablet (5 mg total) by mouth daily for 7 days, THEN 0.5 tablets (2.5 mg total) daily for 7 days. Patient not taking: No sig reported 05/24/24 06/07/24  Nwoko, Uchenna E, PA  cyclobenzaprine  (FLEXERIL ) 10 MG tablet Take 1 tablet (10 mg total) by mouth 2 (two) times daily as needed for muscle spasms. 12/11/23   Silver Wonda LABOR, PA  doxylamine , Sleep, (UNISOM  SLEEPTABS) 25 MG tablet Take 2 tablets (50 mg total) by mouth at bedtime as needed. Patient not taking: Reported on 05/29/2024 05/24/24   Nwoko, Uchenna E, PA  EPINEPHrine  0.3 mg/0.3 mL IJ SOAJ injection Inject 0.3 mg into the muscle as needed for anaphylaxis. 12/11/23   Silver Wonda LABOR, PA  gabapentin  (NEURONTIN ) 400 MG capsule Take 1 capsule (400 mg total) by mouth 3 (three) times daily. 05/24/24 05/24/25  Nwoko, Uchenna E, PA  mirtazapine  (REMERON ) 30 MG tablet Take 1 tablet  (30 mg total) by mouth at bedtime. Patient not taking: Reported on 05/29/2024 02/23/24   Nwoko, Uchenna E, PA  OLANZapine  (ZYPREXA ) 10 MG tablet Take 1 tablet (10 mg total) by mouth at bedtime. Patient not taking: Reported on 05/29/2024 05/24/24   Nwoko, Uchenna E, PA  OLANZapine  (ZYPREXA ) 5 MG tablet Patient to take Olanzapine  5 mg at bedtime for 6 days, then continue taking 10 mg at bedtime. Patient not taking: Reported on 05/29/2024 05/24/24   Nwoko, Uchenna E, PA  ondansetron  (ZOFRAN ) 4 MG tablet Take 1 tablet (4 mg total) by mouth every 6 (six) hours. Patient not taking: Reported on 05/29/2024 01/10/24   [place], PA-C  pantoprazole  (PROTONIX ) 40 MG tablet Take 1 tablet (40 mg total) by mouth daily. 05/29/24   Paseda, Folashade R, FNP  polyethylene glycol (MIRALAX ) 17 g packet Take 17 g by mouth daily. Patient not taking: Reported on 05/29/2024 11/08/23   [name], NP  propranolol  (INDERAL ) 20 MG tablet Take 1 tablet (20 mg total) by mouth 2 (two) times daily as needed. Patient not taking: Reported on 05/29/2024 05/24/24   Nwoko, Uchenna E, PA    Allergies: Egg protein-containing drug products, Fish allergy, Ibuprofen, Mustard, Other, Peanut butter flavoring agent (non-screening), Tylenol  [acetaminophen ], Iodinated contrast media, and Penicillins    Review of Systems  Updated Vital Signs BP (!) 138/91   Pulse 60  Temp 99.1 F (37.3 C) (Oral)   Resp 17   SpO2 98%   Physical Exam Vitals and nursing note reviewed.  Constitutional:      Appearance: He is well-developed.  HENT:     Head: Normocephalic and atraumatic.  Eyes:     Pupils: Pupils are equal, round, and reactive to light.  Neck:     Vascular: No JVD.  Cardiovascular:     Rate and Rhythm: Normal rate and regular rhythm.     Heart sounds: No murmur heard.    No friction rub. No gallop.  Pulmonary:     Effort: No respiratory distress.     Breath sounds: No wheezing.  Abdominal:     General: There is no  distension.     Tenderness: There is no abdominal tenderness. There is no guarding or rebound.  Musculoskeletal:        General: Normal range of motion.     Cervical back: Normal range of motion and neck supple.  Skin:    Coloration: Skin is not pale.     Findings: No rash.  Neurological:     Mental Status: He is alert and oriented to person, place, and time.  Psychiatric:        Behavior: Behavior normal.     (all labs ordered are listed, but only abnormal results are displayed) Labs Reviewed  RESP PANEL BY RT-PCR (RSV, FLU A&B, COVID)  RVPGX2 - Abnormal; Notable for the following components:      Result Value   Influenza A by PCR POSITIVE (*)    All other components within normal limits  CBC - Abnormal; Notable for the following components:   RBC 6.05 (*)    MCV 73.6 (*)    MCH 24.1 (*)    All other components within normal limits  TROPONIN T, HIGH SENSITIVITY - Abnormal; Notable for the following components:   Troponin T High Sensitivity 29 (*)    All other components within normal limits  TROPONIN T, HIGH SENSITIVITY - Abnormal; Notable for the following components:   Troponin T High Sensitivity 31 (*)    All other components within normal limits  LIPASE, BLOOD  COMPREHENSIVE METABOLIC PANEL WITH GFR  URINALYSIS, ROUTINE W REFLEX MICROSCOPIC    EKG: EKG Interpretation Date/Time:  Tuesday June 04 2024 11:23:08 EST Ventricular Rate:  61 PR Interval:  179 QRS Duration:  80 QT Interval:  425 QTC Calculation: 429 R Axis:   58  Text Interpretation: Sinus rhythm Right atrial enlargement Consider left ventricular hypertrophy Anterior Q waves, possibly due to LVH flipped t waves isolated in lead III seen on prior though not most recent Otherwise no significant change Confirmed by Emil Share [id number]) on 06/04/2024 11:27:05 AM  Radiology: No results found.   Procedures   Medications Ordered in the ED  sodium chloride  0.9 % bolus 1,000 mL (1,000 mLs Intravenous New  Bag/Given 06/04/24 1136)                                    Medical Decision Making Amount and/or Complexity of Data Reviewed Labs: ordered.  Risk Prescription drug management.   38 yoM with multiple complaints.  Here primarily for cough congestion fevers going on since yesterday.  No known sick contacts.  He is also upset because he has lost quite a bit of weight and was not sure why.  He has been seen for  this as an outpatient and had plans to see a gastroenterologist but he missed his appointment.  Will check blood work on him here.  Bolus of IV fluids.  Reassess.  Patient reassessed and feeling a bit better.  Patient does have the flu.  His troponin was mildly elevated but flat.  Will discharge home.  PCP follow-up.  2:56 PM:  I have discussed the diagnosis/risks/treatment options with the patient.  Evaluation and diagnostic testing in the emergency department does not suggest an emergent condition requiring admission or immediate intervention beyond what has been performed at this time.  They will follow up with PCP. We also discussed returning to the ED immediately if new or worsening sx occur. We discussed the sx which are most concerning (e.g., sudden worsening pain, fever, inability to tolerate by mouth) that necessitate immediate return. Medications administered to the patient during their visit and any new prescriptions provided to the patient are listed below.  Medications given during this visit Medications  sodium chloride  0.9 % bolus 1,000 mL (1,000 mLs Intravenous New Bag/Given 06/04/24 1136)     The patient appears reasonably screen and/or stabilized for discharge and I doubt any other medical condition or other [facility] requiring further screening, evaluation, or treatment in the ED at this time prior to discharge.       Final diagnoses:  Influenza A  Weight loss    ED Discharge Orders          Ordered    benzonatate  (TESSALON ) 100 MG capsule  Every 8 hours         06/04/24 1452    ondansetron  (ZOFRAN -ODT) 4 MG disintegrating tablet        06/04/24 1452               Emil Share, DO 06/04/24 1456  "

## 2024-06-04 NOTE — ED Notes (Signed)
 ED Provider at bedside.

## 2024-06-07 ENCOUNTER — Other Ambulatory Visit: Payer: Self-pay | Admitting: Nurse Practitioner

## 2024-06-07 ENCOUNTER — Encounter: Payer: Self-pay | Admitting: Nurse Practitioner

## 2024-06-07 ENCOUNTER — Ambulatory Visit: Payer: MEDICAID | Admitting: Nurse Practitioner

## 2024-06-07 VITALS — BP 145/94 | HR 57 | Temp 97.2°F | Wt 154.2 lb

## 2024-06-07 DIAGNOSIS — J101 Influenza due to other identified influenza virus with other respiratory manifestations: Secondary | ICD-10-CM | POA: Diagnosis not present

## 2024-06-07 DIAGNOSIS — R52 Pain, unspecified: Secondary | ICD-10-CM

## 2024-06-07 MED ORDER — AZITHROMYCIN 250 MG PO TABS
ORAL_TABLET | ORAL | 0 refills | Status: AC
  Filled 2024-06-12: qty 6, 5d supply, fill #0

## 2024-06-07 MED ORDER — ALBUTEROL SULFATE HFA 108 (90 BASE) MCG/ACT IN AERS
2.0000 | INHALATION_SPRAY | Freq: Four times a day (QID) | RESPIRATORY_TRACT | 0 refills | Status: AC | PRN
  Filled 2024-06-12: qty 6.7, 30d supply, fill #0

## 2024-06-07 MED ORDER — OXYCODONE HCL 15 MG PO TABS: 15.0000 mg | ORAL_TABLET | Freq: Three times a day (TID) | ORAL | 0 refills | Status: DC | PRN

## 2024-06-07 NOTE — Progress Notes (Signed)
 "  Subjective   Patient ID: Zachary Hood, Hood    DOB: [birth year], 38 y.o.   MRN: [id number]  Chief Complaint  Patient presents with   Pain    Ribs, stomach, and chest area. Currently losing mass muscles.     Referring provider: Oley Zachary RAMAN, NP  Zachary Hood is a 38 y.o. Hood with Past Medical History: No date: Anemia No date: Anxiety No date: Asthma No date: Bipolar 1 disorder (HCC) No date: Depression No date: PTSD (post-traumatic stress disorder)   HPI  The patient presents today for an acute visit.  He was diagnosed with the flu 2 days ago.  He is having generalized bodyaches.  We will order labs.  We will order azithromycin  and give patient a prescription for short course of pain medication. Denies f/c/s, n/v/d, hemoptysis, PND, leg swelling Denies chest pain or edema    Allergies[1]  Immunization History  Administered Date(s) Administered   Tdap 08/08/2015, 04/15/2020, 09/04/2020, 10/27/2021    Tobacco History: Tobacco Use History[2] Counseling given: Not Answered   Outpatient Encounter Medications as of 06/07/2024  Medication Sig   albuterol  (VENTOLIN  HFA) 108 (90 Base) MCG/ACT inhaler Inhale 2 puffs into the lungs every 6 (six) hours as needed for wheezing or shortness of breath.   azithromycin  (ZITHROMAX ) 250 MG tablet Take 2 tablets (500 mg total) by mouth daily for 1 day, THEN 1 tablet (250 mg total) daily for 4 days.   [DISCONTINUED] oxyCODONE  (ROXICODONE ) 15 MG immediate release tablet Take 1 tablet (15 mg total) by mouth every 8 (eight) hours as needed for up to 5 days for pain.   ARIPiprazole  (ABILIFY ) 5 MG tablet Take 1 tablet (5 mg total) by mouth daily for 7 days, THEN 0.5 tablets (2.5 mg total) daily for 7 days. (Patient not taking: Reported on 06/07/2024)   benzonatate  (TESSALON ) 100 MG capsule Take 1 capsule (100 mg total) by mouth every 8 (eight) hours. (Patient not taking: Reported on 06/07/2024)   cyclobenzaprine  (FLEXERIL ) 10 MG tablet  Take 1 tablet (10 mg total) by mouth 2 (two) times daily as needed for muscle spasms. (Patient not taking: Reported on 06/07/2024)   doxylamine , Sleep, (UNISOM  SLEEPTABS) 25 MG tablet Take 2 tablets (50 mg total) by mouth at bedtime as needed. (Patient not taking: Reported on 06/07/2024)   EPINEPHrine  0.3 mg/0.3 mL IJ SOAJ injection Inject 0.3 mg into the muscle as needed for anaphylaxis. (Patient not taking: Reported on 06/07/2024)   gabapentin  (NEURONTIN ) 400 MG capsule Take 1 capsule (400 mg total) by mouth 3 (three) times daily. (Patient not taking: Reported on 06/07/2024)   mirtazapine  (REMERON ) 30 MG tablet Take 1 tablet (30 mg total) by mouth at bedtime. (Patient not taking: Reported on 06/07/2024)   OLANZapine  (ZYPREXA ) 10 MG tablet Take 1 tablet (10 mg total) by mouth at bedtime. (Patient not taking: Reported on 06/07/2024)   OLANZapine  (ZYPREXA ) 5 MG tablet Patient to take Olanzapine  5 mg at bedtime for 6 days, then continue taking 10 mg at bedtime. (Patient not taking: Reported on 06/07/2024)   ondansetron  (ZOFRAN ) 4 MG tablet Take 1 tablet (4 mg total) by mouth every 6 (six) hours. (Patient not taking: Reported on 06/07/2024)   ondansetron  (ZOFRAN -ODT) 4 MG disintegrating tablet Take 1 tablet (4 mg total) by mouth every 4 (four) hours as needed for nausea/vomiting.   pantoprazole  (PROTONIX ) 40 MG tablet Take 1 tablet (40 mg total) by mouth daily. (Patient not taking: Reported on 06/07/2024)   polyethylene  glycol (MIRALAX ) 17 g packet Take 17 g by mouth daily. (Patient not taking: Reported on 06/07/2024)   propranolol  (INDERAL ) 20 MG tablet Take 1 tablet (20 mg total) by mouth 2 (two) times daily as needed. (Patient not taking: Reported on 06/07/2024)   No facility-administered encounter medications on file as of 06/07/2024.    Review of Systems  Review of Systems  Constitutional: Negative.   HENT: Negative.    Cardiovascular: Negative.   Gastrointestinal: Negative.    Allergic/Immunologic: Negative.   Neurological: Negative.   Psychiatric/Behavioral: Negative.       Objective:   BP (!) 145/94 (BP Location: Right Arm, Patient Position: Sitting, Cuff Size: Normal)   Pulse (!) 57   Temp (!) 97.2 F (36.2 C) (Temporal)   Wt 154 lb 3.2 oz (69.9 kg)   SpO2 99%   BMI 24.15 kg/m   Wt Readings from Last 5 Encounters:  06/07/24 154 lb 3.2 oz (69.9 kg)  05/29/24 158 lb (71.7 kg)  01/10/24 158 lb (71.7 kg)  12/11/23 152 lb (68.9 kg)  11/08/23 169 lb (76.7 kg)     Physical Exam Vitals and nursing note reviewed.  Constitutional:      General: He is not in acute distress.    Appearance: He is well-developed.  Cardiovascular:     Rate and Rhythm: Normal rate and regular rhythm.  Pulmonary:     Effort: Pulmonary effort is normal.     Breath sounds: Normal breath sounds.  Skin:    General: Skin is warm and dry.  Neurological:     Mental Status: He is alert and oriented to person, place, and time.       Assessment & Plan:   Body aches -     CBC -     Comprehensive metabolic panel with GFR -     Azithromycin ; Take 2 tablets (500 mg total) by mouth daily for 1 day, THEN 1 tablet (250 mg total) daily for 4 days.  Dispense: 6 tablet; Refill: 0  Influenza A -     CBC -     Comprehensive metabolic panel with GFR -     Azithromycin ; Take 2 tablets (500 mg total) by mouth daily for 1 day, THEN 1 tablet (250 mg total) daily for 4 days.  Dispense: 6 tablet; Refill: 0  Other orders -     Albuterol  Sulfate HFA; Inhale 2 puffs into the lungs every 6 (six) hours as needed for wheezing or shortness of breath.  Dispense: 6.7 g; Refill: 0     Return if symptoms worsen or fail to improve.   Zachary GORMAN Borer, NP 06/12/2024     [1]  Allergies Allergen Reactions   Egg Protein-Containing Drug Products Anaphylaxis   Fish Allergy Anaphylaxis   Ibuprofen Anaphylaxis   Mustard Anaphylaxis   Other Anaphylaxis    Tree Nuts   Peanut Butter Flavoring  Agent (Non-Screening) Anaphylaxis   Tylenol  [Acetaminophen ] Anaphylaxis   Iodinated Contrast Media Hives and Itching    Reaction noted after contrast administration    Penicillins Other (See Comments)    Pt remembers adverse reaction with last admin  [2]  Social History Tobacco Use  Smoking Status Former   Types: Cigars  Smokeless Tobacco Never   "

## 2024-06-08 ENCOUNTER — Encounter (HOSPITAL_COMMUNITY): Payer: Self-pay | Admitting: Physician Assistant

## 2024-06-08 LAB — CBC
Hematocrit: 46.1 % (ref 37.5–51.0)
Hemoglobin: 13.9 g/dL (ref 13.0–17.7)
MCH: 23.9 pg — ABNORMAL LOW (ref 26.6–33.0)
MCHC: 30.2 g/dL — ABNORMAL LOW (ref 31.5–35.7)
MCV: 79 fL (ref 79–97)
Platelets: 222 x10E3/uL (ref 150–450)
RBC: 5.82 x10E6/uL — ABNORMAL HIGH (ref 4.14–5.80)
RDW: 15.3 % (ref 11.6–15.4)
WBC: 5.5 x10E3/uL (ref 3.4–10.8)

## 2024-06-08 LAB — COMPREHENSIVE METABOLIC PANEL WITH GFR
ALT: 25 IU/L (ref 0–44)
AST: 34 IU/L (ref 0–40)
Albumin: 4.5 g/dL (ref 4.1–5.1)
Alkaline Phosphatase: 82 IU/L (ref 47–123)
BUN/Creatinine Ratio: 10 (ref 9–20)
BUN: 11 mg/dL (ref 6–24)
Bilirubin Total: 0.7 mg/dL (ref 0.0–1.2)
CO2: 22 mmol/L (ref 20–29)
Calcium: 9.3 mg/dL (ref 8.7–10.2)
Chloride: 100 mmol/L (ref 96–106)
Creatinine, Ser: 1.13 mg/dL (ref 0.76–1.27)
Globulin, Total: 2.4 g/dL (ref 1.5–4.5)
Glucose: 72 mg/dL (ref 70–99)
Potassium: 3.8 mmol/L (ref 3.5–5.2)
Sodium: 140 mmol/L (ref 134–144)
Total Protein: 6.9 g/dL (ref 6.0–8.5)
eGFR: 84 mL/min/1.73
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
## 2024-06-10 ENCOUNTER — Other Ambulatory Visit (HOSPITAL_COMMUNITY): Payer: Self-pay

## 2024-06-10 ENCOUNTER — Telehealth: Payer: Self-pay | Admitting: Nurse Practitioner

## 2024-06-10 ENCOUNTER — Other Ambulatory Visit (HOSPITAL_BASED_OUTPATIENT_CLINIC_OR_DEPARTMENT_OTHER): Payer: Self-pay

## 2024-06-10 NOTE — Telephone Encounter (Signed)
 oxyCODONE (ROXICODONE) 15 MG immediate release tablet

## 2024-06-10 NOTE — Telephone Encounter (Signed)
 Copied from CRM #[id number]. Topic: Clinical - Red Word Triage >> Jun 07, 2024  4:53 PM Teressa P wrote: Red Word that prompted transfer to Nurse Triage: Pt on the phone who is very upset because he went to get the medication Oxycodone  ay the [facility] where it was sent and they would not give it to him.  I tried to reach out to the office but it would not go through.

## 2024-06-11 ENCOUNTER — Other Ambulatory Visit: Payer: Self-pay | Admitting: Nurse Practitioner

## 2024-06-11 ENCOUNTER — Other Ambulatory Visit (HOSPITAL_COMMUNITY): Payer: Self-pay

## 2024-06-11 MED ORDER — OXYCODONE HCL 15 MG PO TABS: 15.0000 mg | ORAL_TABLET | Freq: Three times a day (TID) | ORAL | 0 refills | Status: DC | PRN

## 2024-06-11 NOTE — Telephone Encounter (Signed)
 New to practice . KH

## 2024-06-11 NOTE — Telephone Encounter (Signed)
 Spoke to pt. He did not advise of medication. KH

## 2024-06-11 NOTE — Telephone Encounter (Signed)
 Spoke to pt advised to wait on them to call. KH

## 2024-06-11 NOTE — Telephone Encounter (Signed)
 Pt was called and advised I will check with Natalie Maude [phone]  for ensure samples for pt . KH

## 2024-06-11 NOTE — Progress Notes (Signed)
 Rx sent to pharmacy. PDMP website reviewed

## 2024-06-12 ENCOUNTER — Ambulatory Visit: Payer: Self-pay | Admitting: Nurse Practitioner

## 2024-06-12 ENCOUNTER — Other Ambulatory Visit: Payer: Self-pay

## 2024-06-12 ENCOUNTER — Other Ambulatory Visit (HOSPITAL_COMMUNITY): Payer: Self-pay

## 2024-06-12 MED ORDER — ARIPIPRAZOLE 5 MG PO TABS
ORAL_TABLET | ORAL | 0 refills | Status: AC
  Filled 2024-06-12: qty 11, 14d supply, fill #0

## 2024-06-12 NOTE — Telephone Encounter (Signed)
 Copied from CRM #[id number]. Topic: Clinical - Prescription Issue >> Jun 12, 2024 10:29 AM Travis F wrote: Reason for CRM: Patient is calling in because he was prescribed Oxycodone  and it was sent to [facility] but [facility] cannot fill it because the dosage is too high. Patient is requesting it be sent to [facility]. Patient is requesting a call when it's sent.

## 2024-06-14 ENCOUNTER — Other Ambulatory Visit (HOSPITAL_COMMUNITY): Payer: Self-pay

## 2024-06-14 ENCOUNTER — Encounter (HOSPITAL_COMMUNITY): Payer: Self-pay

## 2024-06-14 ENCOUNTER — Other Ambulatory Visit (HOSPITAL_BASED_OUTPATIENT_CLINIC_OR_DEPARTMENT_OTHER): Payer: Self-pay

## 2024-06-14 ENCOUNTER — Other Ambulatory Visit: Payer: Self-pay

## 2024-06-14 ENCOUNTER — Telehealth (INDEPENDENT_AMBULATORY_CARE_PROVIDER_SITE_OTHER): Payer: MEDICAID | Admitting: Physician Assistant

## 2024-06-14 DIAGNOSIS — F4310 Post-traumatic stress disorder, unspecified: Secondary | ICD-10-CM | POA: Diagnosis not present

## 2024-06-14 DIAGNOSIS — F411 Generalized anxiety disorder: Secondary | ICD-10-CM

## 2024-06-14 DIAGNOSIS — F200 Paranoid schizophrenia: Secondary | ICD-10-CM | POA: Diagnosis not present

## 2024-06-14 DIAGNOSIS — F5105 Insomnia due to other mental disorder: Secondary | ICD-10-CM | POA: Diagnosis not present

## 2024-06-14 MED ORDER — OXYCODONE HCL 15 MG PO TABS
15.0000 mg | ORAL_TABLET | Freq: Three times a day (TID) | ORAL | 0 refills | Status: AC | PRN
  Filled 2024-06-14 – 2024-06-18 (×2): qty 15, 5d supply, fill #0

## 2024-06-14 NOTE — Telephone Encounter (Signed)
 Copied from CRM #[id number]. Topic: Clinical - Prescription Issue >> Jun 14, 2024  2:04 PM Delon DASEN wrote: oxyCODONE  (ROXICODONE ) 15 MG immediate release tablet - still waiting for prescription to be sent to [facility]

## 2024-06-18 ENCOUNTER — Other Ambulatory Visit (HOSPITAL_BASED_OUTPATIENT_CLINIC_OR_DEPARTMENT_OTHER): Payer: Self-pay

## 2024-06-18 ENCOUNTER — Other Ambulatory Visit (HOSPITAL_COMMUNITY): Payer: Self-pay

## 2024-06-24 ENCOUNTER — Encounter: Payer: Self-pay | Admitting: Gastroenterology

## 2024-06-25 ENCOUNTER — Telehealth: Payer: Self-pay

## 2024-06-25 NOTE — Telephone Encounter (Signed)
 Please advise. CB.  Copied from CRM #[id number]. Topic: General - Inquiry >> Jun 24, 2024  5:02 PM Kevelyn M wrote: Reason for CRM: Patient is on probation. Has a court date for probation violation. He is in need of a formal letter from provider stating he has appointments lined up in concerns for his health to get court date continued so he won't get locked up before his appointments to find out what's wrong with him. Patient has a court date 06/27/2024. It needs to be faxed ASAP to his lawyer. Lawyer # [phone]  Call back #[phone]

## 2024-06-25 NOTE — Telephone Encounter (Signed)
 Pt has been advised on message below. Per patient he is requesting a note that states that he has upcoming appointments scheduled with GI. Pt has been advised that he can contact GI for note stating the time and date of his appointment. He voiced his understanding and states that he will give them a call back.

## 2024-07-03 ENCOUNTER — Telehealth (HOSPITAL_COMMUNITY): Payer: Self-pay | Admitting: Physician Assistant

## 2024-07-03 MED ORDER — UNISOM SLEEPTABS 25 MG PO TABS
50.0000 mg | ORAL_TABLET | Freq: Every evening | ORAL | 1 refills | Status: AC | PRN
  Filled 2024-07-03: qty 64, 32d supply, fill #0

## 2024-07-03 MED ORDER — OLANZAPINE 10 MG PO TABS
10.0000 mg | ORAL_TABLET | Freq: Every day | ORAL | 1 refills | Status: AC
  Filled 2024-07-03: qty 30, 30d supply, fill #0

## 2024-07-03 MED ORDER — GABAPENTIN 400 MG PO CAPS
400.0000 mg | ORAL_CAPSULE | Freq: Three times a day (TID) | ORAL | 1 refills | Status: AC
  Filled 2024-07-03: qty 90, 30d supply, fill #0

## 2024-07-03 MED ORDER — OLANZAPINE 5 MG PO TABS
ORAL_TABLET | ORAL | 0 refills | Status: AC
  Filled 2024-07-03: qty 6, 6d supply, fill #0

## 2024-07-03 MED ORDER — PROPRANOLOL HCL 20 MG PO TABS
20.0000 mg | ORAL_TABLET | Freq: Two times a day (BID) | ORAL | 1 refills | Status: AC | PRN
  Filled 2024-07-03: qty 60, 30d supply, fill #0

## 2024-07-03 NOTE — Telephone Encounter (Signed)
 Patient's wife called and said that her husband forgets to take the medication and maybe the shot would work better. Patient's wife is concerned about his behavior and would like to speak to [place]. Patient hasn't gotten his script filled and wife would like medication delivered. Wife was made aware that it could be 24-72 hours before she gets a return call. Again, patient's would like to speak with Eddie. Her name is Roshanna. [phone] is the best number to reach her. Patient's pharmacy is the [facility]. Her e-mail is [email or web address].  Please advise. Thank you.

## 2024-07-04 ENCOUNTER — Other Ambulatory Visit: Payer: Self-pay

## 2024-07-05 ENCOUNTER — Encounter: Payer: Self-pay | Admitting: Pharmacist

## 2024-07-05 ENCOUNTER — Other Ambulatory Visit: Payer: Self-pay

## 2024-07-09 ENCOUNTER — Other Ambulatory Visit: Payer: Self-pay

## 2024-07-09 ENCOUNTER — Other Ambulatory Visit (HOSPITAL_COMMUNITY): Payer: Self-pay

## 2024-07-13 ENCOUNTER — Encounter (HOSPITAL_COMMUNITY): Payer: Self-pay | Admitting: Physician Assistant

## 2024-07-18 ENCOUNTER — Other Ambulatory Visit: Payer: Self-pay

## 2024-07-18 ENCOUNTER — Ambulatory Visit: Payer: MEDICAID | Admitting: Gastroenterology

## 2024-07-18 ENCOUNTER — Encounter: Payer: Self-pay | Admitting: Gastroenterology

## 2024-07-18 ENCOUNTER — Other Ambulatory Visit (HOSPITAL_COMMUNITY): Payer: Self-pay

## 2024-07-18 VITALS — BP 116/72 | HR 60 | Ht 67.0 in | Wt 148.0 lb

## 2024-07-18 DIAGNOSIS — K219 Gastro-esophageal reflux disease without esophagitis: Secondary | ICD-10-CM | POA: Diagnosis not present

## 2024-07-18 DIAGNOSIS — K5909 Other constipation: Secondary | ICD-10-CM | POA: Diagnosis not present

## 2024-07-18 DIAGNOSIS — R634 Abnormal weight loss: Secondary | ICD-10-CM

## 2024-07-18 DIAGNOSIS — R1013 Epigastric pain: Secondary | ICD-10-CM | POA: Diagnosis not present

## 2024-07-18 DIAGNOSIS — R194 Change in bowel habit: Secondary | ICD-10-CM | POA: Insufficient documentation

## 2024-07-18 DIAGNOSIS — M6250 Muscle wasting and atrophy, not elsewhere classified, unspecified site: Secondary | ICD-10-CM

## 2024-07-18 MED ORDER — NA SULFATE-K SULFATE-MG SULF 17.5-3.13-1.6 GM/177ML PO SOLN
1.0000 | Freq: Once | ORAL | 0 refills | Status: AC
  Filled 2024-07-18: qty 354, 1d supply, fill #0

## 2024-07-18 NOTE — Patient Instructions (Signed)
 You have been scheduled for an endoscopy and colonoscopy. Please follow the written instructions given to you at your visit today.  If you use inhalers (even only as needed), please bring them with you on the day of your procedure.  DO NOT TAKE 7 DAYS PRIOR TO TEST- Trulicity (dulaglutide) Ozempic, Wegovy (semaglutide) Mounjaro, Zepbound (tirzepatide) Bydureon Bcise (exanatide extended release)  DO NOT TAKE 1 DAY PRIOR TO YOUR TEST Rybelsus (semaglutide) Adlyxin (lixisenatide) Victoza (liraglutide) Byetta (exanatide) _________________________________________________________________________  _______________________________________________________  If your blood pressure at your visit was 140/90 or greater, please contact your primary care physician to follow up on this.  _______________________________________________________  If you are age 15 or older, your body mass index should be between 23-30. Your Body mass index is 23.18 kg/m. If this is out of the aforementioned range listed, please consider follow up with your Primary Care Provider.  If you are age 46 or younger, your body mass index should be between 19-25. Your Body mass index is 23.18 kg/m. If this is out of the aformentioned range listed, please consider follow up with your Primary Care Provider.   ________________________________________________________  The [facility] providers would like to encourage you to use MYCHART to communicate with providers for non-urgent requests or questions.  Due to long hold times on the telephone, sending your provider a message by [facility] may be a faster and more efficient way to get a response.  Please allow 48 business hours for a response.  Please remember that this is for non-urgent requests.  _______________________________________________________  [facility] is using a team-based approach to care.  Your team is made up of your doctor and two to three APPS. Our APPS  (Nurse Practitioners and Physician Assistants) work with your physician to ensure care continuity for you. They are fully qualified to address your health concerns and develop a treatment plan. They communicate directly with your gastroenterologist to care for you. Seeing the Advanced Practice Practitioners on your physician's team can help you by facilitating care more promptly, often allowing for earlier appointments, access to diagnostic testing, procedures, and other specialty referrals.

## 2024-07-18 NOTE — Progress Notes (Signed)
 Agree with the assessment and plan as outlined by [name], PA-C.  EGD and colonoscopy reasonable to exclude GI malignancy in patient with GI symptoms and unintentional weight loss.
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
## 2024-07-19 ENCOUNTER — Encounter (HOSPITAL_COMMUNITY): Payer: Self-pay | Admitting: Physician Assistant

## 2024-07-19 ENCOUNTER — Telehealth (HOSPITAL_COMMUNITY): Payer: MEDICAID | Admitting: Physician Assistant

## 2024-07-19 DIAGNOSIS — F4310 Post-traumatic stress disorder, unspecified: Secondary | ICD-10-CM

## 2024-07-19 DIAGNOSIS — F411 Generalized anxiety disorder: Secondary | ICD-10-CM

## 2024-07-19 DIAGNOSIS — F5105 Insomnia due to other mental disorder: Secondary | ICD-10-CM

## 2024-07-19 DIAGNOSIS — F200 Paranoid schizophrenia: Secondary | ICD-10-CM
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
## 2024-07-31 ENCOUNTER — Ambulatory Visit: Payer: Self-pay | Admitting: Nurse Practitioner

## 2024-08-14 ENCOUNTER — Encounter: Payer: MEDICAID | Admitting: Gastroenterology

## 2024-08-30 ENCOUNTER — Telehealth (HOSPITAL_COMMUNITY): Payer: MEDICAID | Admitting: Physician Assistant
